# Patient Record
Sex: Female | Born: 1995 | Race: Black or African American | Hispanic: No | Marital: Single | Smoking: Former smoker
Health system: Southern US, Community
[De-identification: ages and names within clinical notes are randomized; demographics above are authoritative.]

## PROBLEM LIST (undated history)

## (undated) ENCOUNTER — Inpatient Hospital Stay (HOSPITAL_COMMUNITY): Payer: Self-pay

## (undated) DIAGNOSIS — O139 Gestational [pregnancy-induced] hypertension without significant proteinuria, unspecified trimester: Secondary | ICD-10-CM

## (undated) HISTORY — PX: CERVICAL CERCLAGE: SHX1329

---

## 2014-09-21 HISTORY — PX: DILATION AND CURETTAGE OF UTERUS: SHX78

## 2018-06-03 ENCOUNTER — Encounter (HOSPITAL_COMMUNITY): Payer: Self-pay

## 2018-06-03 ENCOUNTER — Emergency Department (HOSPITAL_COMMUNITY)
Admission: EM | Admit: 2018-06-03 | Discharge: 2018-06-03 | Discharge status: Against Medical Advice | Payer: 59 | Attending: Emergency Medicine | Admitting: Emergency Medicine

## 2018-06-03 ENCOUNTER — Other Ambulatory Visit: Payer: Self-pay

## 2018-06-03 DIAGNOSIS — O209 Hemorrhage in early pregnancy, unspecified: Secondary | ICD-10-CM | POA: Insufficient documentation

## 2018-06-03 DIAGNOSIS — Z3A00 Weeks of gestation of pregnancy not specified: Secondary | ICD-10-CM | POA: Diagnosis not present

## 2018-06-03 DIAGNOSIS — Z5321 Procedure and treatment not carried out due to patient leaving prior to being seen by health care provider: Secondary | ICD-10-CM | POA: Insufficient documentation

## 2018-06-03 DIAGNOSIS — R109 Unspecified abdominal pain: Secondary | ICD-10-CM | POA: Insufficient documentation

## 2018-06-03 DIAGNOSIS — O9989 Other specified diseases and conditions complicating pregnancy, childbirth and the puerperium: Secondary | ICD-10-CM | POA: Insufficient documentation

## 2018-06-03 LAB — COMPREHENSIVE METABOLIC PANEL
ALT: 7 U/L (ref 0–44)
AST: 14 U/L — ABNORMAL LOW (ref 15–41)
Albumin: 3.5 g/dL (ref 3.5–5.0)
Alkaline Phosphatase: 61 U/L (ref 38–126)
Anion gap: 10 (ref 5–15)
BUN: 9 mg/dL (ref 6–20)
CHLORIDE: 106 mmol/L (ref 98–111)
CO2: 25 mmol/L (ref 22–32)
CREATININE: 0.43 mg/dL — AB (ref 0.44–1.00)
Calcium: 9.3 mg/dL (ref 8.9–10.3)
GFR calc Af Amer: >60 mL/min (ref >60)
GFR calc non Af Amer: >60 mL/min (ref >60)
Glucose, Bld: 77 mg/dL (ref 70–99)
Potassium: 3.6 mmol/L (ref 3.5–5.1)
Sodium: 141 mmol/L (ref 135–145)
Total Bilirubin: 0.5 mg/dL (ref 0.3–1.2)
Total Protein: 7 g/dL (ref 6.5–8.1)

## 2018-06-03 LAB — CBC
HCT: 34 % — ABNORMAL LOW (ref 36.0–46.0)
Hemoglobin: 12 g/dL (ref 12.0–15.0)
MCH: 32 pg (ref 26.0–34.0)
MCHC: 35.3 g/dL (ref 30.0–36.0)
MCV: 90.7 fL (ref 78.0–100.0)
PLATELETS: 267 10*3/uL (ref 150–400)
RBC: 3.75 MIL/uL — ABNORMAL LOW (ref 3.87–5.11)
RDW: 12.7 % (ref 11.5–15.5)
WBC: 9.7 10*3/uL (ref 4.0–10.5)

## 2018-06-03 LAB — I-STAT BETA HCG BLOOD, ED (MC, WL, AP ONLY)

## 2018-06-03 LAB — LIPASE, BLOOD: LIPASE: 28 U/L (ref 11–51)

## 2018-06-03 NOTE — ED Notes (Signed)
Pt called for recheck v/s, no response from lobby 

## 2018-06-03 NOTE — ED Notes (Signed)
Pt asked to speak to staff re: wait times. Pt asked how low long before being seen and how many people are waiting prior to her arrival to be seen. Explained to pt unable to give specific wait times; pts will be seen according to acuity and room availability as soon as possible. Offered comfort measures; such as blanket. Vitals recently updated by EMT; per pt no immediate complaints/no visible signs of severe stress. RN advised. [employer]

## 2018-06-03 NOTE — ED Triage Notes (Addendum)
Pt reports that she is 5 months pregnant and experiencing a lot of cramping and pain since last night. Reports pinkish discharge. Denies any frank bleeding. Last check up was on 9/5 and everything was normal. Denies any N/V/D. Pt also reports an insect bite on her L lower leg. Redness noted around site. A&Ox4. Ambulatory. NAD.

## 2018-06-03 NOTE — ED Notes (Signed)
Pt called x3. No response 

## 2018-06-04 ENCOUNTER — Encounter (HOSPITAL_COMMUNITY): Payer: Self-pay | Admitting: Anesthesiology

## 2018-06-04 ENCOUNTER — Inpatient Hospital Stay (HOSPITAL_BASED_OUTPATIENT_CLINIC_OR_DEPARTMENT_OTHER): Payer: Medicaid - Out of State

## 2018-06-04 ENCOUNTER — Other Ambulatory Visit: Payer: Self-pay

## 2018-06-04 ENCOUNTER — Observation Stay (HOSPITAL_COMMUNITY)
Admission: EM | Admit: 2018-06-04 | Discharge: 2018-06-05 | Disposition: A | Discharge status: Home / Self Care | Payer: Medicaid - Out of State | Attending: Obstetrics and Gynecology | Admitting: Obstetrics and Gynecology

## 2018-06-04 ENCOUNTER — Encounter (HOSPITAL_COMMUNITY): Payer: Self-pay | Admitting: *Deleted

## 2018-06-04 ENCOUNTER — Telehealth: Payer: Self-pay | Admitting: Medical

## 2018-06-04 DIAGNOSIS — Z3A21 21 weeks gestation of pregnancy: Secondary | ICD-10-CM

## 2018-06-04 DIAGNOSIS — O9989 Other specified diseases and conditions complicating pregnancy, childbirth and the puerperium: Secondary | ICD-10-CM | POA: Diagnosis not present

## 2018-06-04 DIAGNOSIS — O26872 Cervical shortening, second trimester: Secondary | ICD-10-CM | POA: Diagnosis not present

## 2018-06-04 DIAGNOSIS — O209 Hemorrhage in early pregnancy, unspecified: Secondary | ICD-10-CM | POA: Insufficient documentation

## 2018-06-04 DIAGNOSIS — O09212 Supervision of pregnancy with history of pre-term labor, second trimester: Secondary | ICD-10-CM | POA: Diagnosis not present

## 2018-06-04 DIAGNOSIS — O09219 Supervision of pregnancy with history of pre-term labor, unspecified trimester: Secondary | ICD-10-CM

## 2018-06-04 DIAGNOSIS — L0291 Cutaneous abscess, unspecified: Secondary | ICD-10-CM

## 2018-06-04 DIAGNOSIS — O3432 Maternal care for cervical incompetence, second trimester: Secondary | ICD-10-CM

## 2018-06-04 DIAGNOSIS — R109 Unspecified abdominal pain: Secondary | ICD-10-CM | POA: Diagnosis not present

## 2018-06-04 DIAGNOSIS — L02416 Cutaneous abscess of left lower limb: Secondary | ICD-10-CM | POA: Insufficient documentation

## 2018-06-04 DIAGNOSIS — O4692 Antepartum hemorrhage, unspecified, second trimester: Secondary | ICD-10-CM | POA: Diagnosis not present

## 2018-06-04 DIAGNOSIS — Z3686 Encounter for antenatal screening for cervical length: Secondary | ICD-10-CM

## 2018-06-04 DIAGNOSIS — O99712 Diseases of the skin and subcutaneous tissue complicating pregnancy, second trimester: Secondary | ICD-10-CM | POA: Insufficient documentation

## 2018-06-04 DIAGNOSIS — Z3A22 22 weeks gestation of pregnancy: Secondary | ICD-10-CM | POA: Insufficient documentation

## 2018-06-04 DIAGNOSIS — O4690 Antepartum hemorrhage, unspecified, unspecified trimester: Secondary | ICD-10-CM

## 2018-06-04 DIAGNOSIS — R1030 Lower abdominal pain, unspecified: Secondary | ICD-10-CM

## 2018-06-04 HISTORY — DX: Gestational (pregnancy-induced) hypertension without significant proteinuria, unspecified trimester: O13.9

## 2018-06-04 LAB — SAMPLE TO BLOOD BANK

## 2018-06-04 LAB — TYPE AND SCREEN
ABO/RH(D): O POS
Antibody Screen: NEGATIVE

## 2018-06-04 LAB — CBC
HEMATOCRIT: 35.9 % — AB (ref 36.0–46.0)
HEMOGLOBIN: 12.2 g/dL (ref 12.0–15.0)
MCH: 31.4 pg (ref 26.0–34.0)
MCHC: 34 g/dL (ref 30.0–36.0)
MCV: 92.3 fL (ref 78.0–100.0)
Platelets: 247 10*3/uL (ref 150–400)
RBC: 3.89 MIL/uL (ref 3.87–5.11)
RDW: 12.6 % (ref 11.5–15.5)
WBC: 11.8 10*3/uL — AB (ref 4.0–10.5)

## 2018-06-04 LAB — URINALYSIS, ROUTINE W REFLEX MICROSCOPIC
Bilirubin Urine: NEGATIVE
Glucose, UA: NEGATIVE mg/dL
Hgb urine dipstick: NEGATIVE
KETONES UR: NEGATIVE mg/dL
Nitrite: NEGATIVE
PROTEIN: NEGATIVE mg/dL
Specific Gravity, Urine: 1.017 (ref 1.005–1.030)
pH: 8 (ref 5.0–8.0)

## 2018-06-04 LAB — COMPREHENSIVE METABOLIC PANEL
ALT: 8 U/L (ref 0–44)
AST: 14 U/L — AB (ref 15–41)
Albumin: 3.3 g/dL — ABNORMAL LOW (ref 3.5–5.0)
Alkaline Phosphatase: 63 U/L (ref 38–126)
Anion gap: 11 (ref 5–15)
BUN: <5 mg/dL — ABNORMAL LOW (ref 6–20)
CHLORIDE: 102 mmol/L (ref 98–111)
CO2: 22 mmol/L (ref 22–32)
Calcium: 9.1 mg/dL (ref 8.9–10.3)
Creatinine, Ser: 0.49 mg/dL (ref 0.44–1.00)
Glucose, Bld: 77 mg/dL (ref 70–99)
POTASSIUM: 3.3 mmol/L — AB (ref 3.5–5.1)
Sodium: 135 mmol/L (ref 135–145)
Total Bilirubin: 0.6 mg/dL (ref 0.3–1.2)
Total Protein: 6.4 g/dL — ABNORMAL LOW (ref 6.5–8.1)

## 2018-06-04 LAB — I-STAT BETA HCG BLOOD, ED (MC, WL, AP ONLY): I-stat hCG, quantitative: >2000 m[IU]/mL — ABNORMAL HIGH (ref <5)

## 2018-06-04 LAB — WET PREP, GENITAL
CLUE CELLS WET PREP: NONE SEEN
Sperm: NONE SEEN
Trich, Wet Prep: NONE SEEN
Yeast Wet Prep HPF POC: NONE SEEN

## 2018-06-04 LAB — ABO/RH: ABO/RH(D): O POS

## 2018-06-04 LAB — LIPASE, BLOOD: LIPASE: 23 U/L (ref 11–51)

## 2018-06-04 MED ORDER — LACTATED RINGERS IV SOLN: INTRAVENOUS | Status: DC

## 2018-06-04 MED ORDER — SODIUM CHLORIDE 0.9 % IV SOLN
INTRAVENOUS | Status: DC
  Administered 2018-06-04 – 2018-06-05 (×2): via INTRAVENOUS

## 2018-06-04 MED ORDER — AMOXICILLIN-POT CLAVULANATE 875-125 MG PO TABS: 1.0000 | ORAL_TABLET | Freq: Two times a day (BID) | ORAL | 0 refills | Status: DC

## 2018-06-04 MED ORDER — PRENATAL MULTIVITAMIN CH
1.0000 | ORAL_TABLET | Freq: Every day | ORAL | Status: DC
  Administered 2018-06-05: 1 via ORAL
  Filled 2018-06-04: qty 1

## 2018-06-04 MED ORDER — ACETAMINOPHEN 325 MG PO TABS: 650.0000 mg | ORAL_TABLET | ORAL | Status: DC | PRN

## 2018-06-04 MED ORDER — ZOLPIDEM TARTRATE 5 MG PO TABS: 5.0000 mg | ORAL_TABLET | Freq: Every evening | ORAL | Status: DC | PRN

## 2018-06-04 MED ORDER — LIDOCAINE HCL (PF) 1 % IJ SOLN
5.0000 mL | Freq: Once | INTRAMUSCULAR | Status: AC
  Administered 2018-06-04: 5 mL
  Filled 2018-06-04: qty 5

## 2018-06-04 MED ORDER — DOCUSATE SODIUM 100 MG PO CAPS
100.0000 mg | ORAL_CAPSULE | Freq: Every day | ORAL | Status: DC
  Administered 2018-06-05: 100 mg via ORAL
  Filled 2018-06-04: qty 1

## 2018-06-04 MED ORDER — CALCIUM CARBONATE ANTACID 500 MG PO CHEW: 2.0000 | CHEWABLE_TABLET | ORAL | Status: DC | PRN

## 2018-06-04 NOTE — MAU Note (Signed)
Pt. [facility].  Pt. Reports vag bleeding and dc since Thursday.  Pt. Denies LOF.  Pt. States she had some abd. Pain earlier today, but is not currently having any pain.

## 2018-06-04 NOTE — Progress Notes (Addendum)
Patient ID: Mary Allen, female   DOB: [birth year], 22 y.o.   MRN: [id number]  Gyn Attending: Phone conversation with Provider, describing vaginal bleeding and pressure symptoms in this 6468w4d G1P0 from [place], visiting area with complaints of pelvic pressure and vaginal bleeding small amount, with speculum exam reported as no current bleeding and an atypical appearing cervix , not unexpected for a pt with cerclage in place. [facility] for continuation of eval: plan is for pelvic U/S with cervical length measurement, Transvaginal eval to be included.  Tilda BurrowJohn V Ebonee Stober, MD [phone]

## 2018-06-04 NOTE — ED Notes (Signed)
[facility]

## 2018-06-04 NOTE — Telephone Encounter (Addendum)
Opened in error  Kathlene CoteWenzel, Memory Heinrichs N, PA-C 06/04/2018 5:52 PM

## 2018-06-04 NOTE — ED Provider Notes (Signed)
[facility] EMERGENCY [facility] Provider Note   CSN: [id number] Arrival date & time: 06/04/18  1003     History   Chief Complaint Chief Complaint  Patient presents with  . Abdominal Pain    HPI Mary Allen is a 22 y.o. female who is currently G2P1, [redacted] weeks pregnant requiring cervical cerclage due to early dilation last month who presents with complaints of abdominal discomfort intermittently x 2 days.  Patient states she is having abdominal cramping in the pelvic/lower abdomen that is coming and going, lasting for approximately 1 to 2 minutes, improved with certain positions.  She has not felt the baby move since yesterday afternoon. She is also noted some vaginal discharge that has blood in it. Had one episode of vomiting yesterday.  She states this feels similar to the cramping she was having prior to requiring the cerclage.  Her OB/GYN is located in [place] at [facility].  Her prior pregnancy was complicated with early cervical dilation resulting in preterm vaginal  birth at 27 weeks, otherwise uncomplicated.  Denies fever, chills, diarrhea, or blood in stool.   Patient also mentions LLE redness/pain/swelling x 3-4 days.   HPI  History reviewed. No pertinent past medical history.  There are no active problems to display for this patient.   History reviewed. No pertinent surgical history.   OB History    Gravida  1   Para      Term      Preterm      AB      Living        SAB      TAB      Ectopic      Multiple      Live Births               Home Medications    Prior to Admission medications   Not on File    Family History History reviewed. No pertinent family history.  Social History Social History   Tobacco Use  . Smoking status: Never Smoker  . Smokeless tobacco: Never Used  Substance Use Topics  . Alcohol use: Not on file  . Drug use: Not on file     Allergies   Patient has no known  allergies.   Review of Systems Review of Systems  Constitutional: Negative for chills and fever.  Gastrointestinal: Positive for abdominal pain and vomiting. Negative for blood in stool, constipation and diarrhea.  Genitourinary: Positive for vaginal bleeding and vaginal discharge.  Skin: Positive for wound.       Also mentions area of redness and swelling to the left lower leg which has been here for the past 3 to 4 days.  Painful.  All other systems reviewed and are negative.    Physical Exam Updated Vital Signs BP 119/72 (BP Location: Right Arm)   Pulse 92   Temp 98.5 F (36.9 C) (Oral)   Resp 20   SpO2 100%   Physical Exam  Constitutional: She appears well-developed and well-nourished.  Non-toxic appearance. No distress.  HENT:  Head: Normocephalic and atraumatic.  Eyes: Conjunctivae are normal. Right eye exhibits no discharge. Left eye exhibits no discharge.  Neck: Neck supple.  Cardiovascular: Normal rate and regular rhythm.  Pulmonary/Chest: Effort normal and breath sounds normal. No respiratory distress. She has no wheezes. She has no rhonchi. She has no rales.  Respiration even and unlabored  Abdominal: There is no tenderness.  Gravid appearing abdomen. Fundus just above  the umbilicus.   Genitourinary: Pelvic exam was performed with patient supine. There is no tenderness on the right labia. There is no tenderness on the left labia. No bleeding in the vagina. Vaginal discharge (white thick) found.  Genitourinary Comments: Cervical os is abnormal appearing, possibly open. Sutures in place consistent with hx of cervical cerclage.  RN Albin Felling present as chaperone.   Neurological: She is alert.  Clear speech. Sensation grossly intact to bilateral lower extremities. 5/5 symmetric strength with plantar/dorsiflexion.   Skin: Skin is warm and dry. No rash noted.  LLE: There is an approximately 7 cm diameter area of erythema to the lateral lower leg with palpable induration.   This area is warm to touch.  Tender to palpation.  Centrally there is an area of about 2 cm with palpable fluctuance.  Erythema does not have a streaking appearance.   Psychiatric: She has a normal mood and affect. Her behavior is normal.  Nursing note and vitals reviewed.      ED Treatments / Results  Labs (all labs ordered are listed, but only abnormal results are displayed) Labs Reviewed  WET PREP, GENITAL - Abnormal; Notable for the following components:      Result Value   WBC, Wet Prep HPF POC MANY (*)    All other components within normal limits  COMPREHENSIVE METABOLIC PANEL - Abnormal; Notable for the following components:   Potassium 3.3 (*)    BUN <5 (*)    Total Protein 6.4 (*)    Albumin 3.3 (*)    AST 14 (*)    All other components within normal limits  CBC - Abnormal; Notable for the following components:   WBC 11.8 (*)    HCT 35.9 (*)    All other components within normal limits  URINALYSIS, ROUTINE W REFLEX MICROSCOPIC - Abnormal; Notable for the following components:   APPearance CLOUDY (*)    Leukocytes, UA MODERATE (*)    Bacteria, UA RARE (*)    All other components within normal limits  I-STAT BETA HCG BLOOD, ED ([facility], [facility], [facility] ONLY) - Abnormal; Notable for the following components:   I-stat hCG, quantitative >2,000.0 (*)    All other components within normal limits  LIPASE, BLOOD  SAMPLE TO BLOOD BANK  ABO/RH  GC/CHLAMYDIA PROBE AMP ([facility]) NOT AT [facility]    EKG None  Radiology No results found.  Procedures  EMERGENCY DEPARTMENT [place] PREGNANCY "Study: Limited Ultrasound of the Pelvis for Pregnancy"  INDICATIONS:Pregnancy(required) and Abdominal or pelvic pain Multiple views of the uterus and pelvic cavity were obtained in real-time with a multi-frequency probe.  APPROACH:Transabdominal  PERFORMED BY: Supervising physician Dr. Rush Landmark IMAGES ARCHIVED?: Yes FETAL HEART RATE: 150   EMERGENCY DEPARTMENT US SOFT TISSUE  INTERPRETATION "Study: Limited Soft Tissue Ultrasound"  INDICATIONS: Pain and Soft tissue infection Multiple views of the body part were obtained in real-time with a multi-frequency linear probe  PERFORMED BY: Myself IMAGES ARCHIVED?: No SIDE:Left and Right  BODY PART:Lower extremity INTERPRETATION:  Abcess present  .Marland KitchenIncision and Drainage Date/Time: 06/04/2018 1:28 PM Performed by: Cherly Anderson, PA-C Authorized by: Cherly Anderson, PA-C   Consent:    Consent obtained:  Verbal   Consent given by:  Patient   Risks discussed:  Bleeding, damage to other organs, infection, incomplete drainage and pain   Alternatives discussed:  No treatment and alternative treatment Location:    Type:  Abscess   Size:  2   Location:  Lower extremity  Lower extremity location:  Leg   Leg location:  L lower leg Pre-procedure details:    Skin preparation:  Betadine Anesthesia (see MAR for exact dosages):    Anesthesia method:  Local infiltration   Local anesthetic:  Lidocaine 1% w/o epi Procedure type:    Complexity:  Simple Procedure details:    Incision types:  Stab incision   Scalpel blade:  11   Wound management:  Probed and deloculated and irrigated with saline   Drainage:  Bloody and purulent   Drainage amount:  Moderate   Wound treatment:  Wound left open   Packing materials:  None Post-procedure details:    Patient tolerance of procedure:  Tolerated well, no immediate complications   (including critical care time)  Medications Ordered in ED Medications - No data to display   Initial Impression / Assessment and Plan / ED Course  I have reviewed the triage vital signs and the nursing notes.  Pertinent labs & imaging results that were available during my care of the patient were reviewed by me and considered in my medical decision making (see chart for details).   Patient is [redacted] weeks pregnant, pregnancy complicated by cerclage placement 1 month prior who  presents with intermittent abdominal pain which she states feel like contraction with associated with the vaginal discharge as well as not feeling the baby move.  Abdomen nontender.  Bedside ultrasound performed with supervising physician Dr. Rush Landmark reveals that the baby is moving with a heart rate of 140s to 150s.  Plan to consult OB/GYN.   11:50: CONSULT: Discussed case with obstetrician Dr. Macon Large- recommends pelvic exam to assess if cervical os is open or closed, requesting GC/chlamydia and wet prep cultures be performed. Will need ultrasound to determine internal cervical length- possibly to be performed here or with transfer to womens.   Pelvic exam performed, cervix is abnormal appearing, difficult assessment, will re-discuss with obgyn.   12:58: CONSULT: Discussed with obstetrician Dr. Emelda Fear- in agreement with transfer to MAU for further evaluation.   In regards to patient's mentioned RLE pain/swelling/redness- bedside US confirms abscess with surrounding cellulitis, beside I&D performed per procedure note above, tolerated well. Patient has hx of similar that has required abx. Discussed with pharmacy- recommend Augmentin 875 q12h for 5 days. Paper prescription provided. Discussed importance of warm compresses. Wound recheck in 2 days. I discussed results, treatment plan, need for follow-up, and return precautions with the patient in regards to her abscess/cellulitis. Defer further ob recommendations to obstetrician upon his/her assessment.   Findings and plan of care discussed with supervising physician Dr. Rush Landmark who personally evaluated and examined this patient and is in agreement with plan.    Final Clinical Impressions(s) / ED Diagnoses   Final diagnoses:  [redacted] weeks gestation of pregnancy  Lower abdominal pain  Abscess    ED Discharge Orders    None       Cherly Anderson, PA-C 06/04/18 1509    Tegeler, Canary Brim, MD 06/04/18 [phone]

## 2018-06-04 NOTE — H&P (Signed)
Mary Allen is a 22 y.o. female admitted for overnight observation due to short cervix and an episode of vaginal bleeding noted early on 06/04/2018 upon returning to bed from the bathroom, without recurrence.  She was seen at [facility] and transferred to [facility] hospital after presenting for the bleeding.  She is new to [facility] having had prenatal care at [facility] that was notable for placement of a Mersilene cerclage August 15 due to "short cervix" at [facility].  She moved to [facility] on August 6.  She has been receiving 17 P self-administered injections weekly with last injection September 12.  She has had no recent intercourse.  She denies rupture membranes or abdominal cramping.  She did have some questionable abdominal tightening upon initial arrival to the hospital but none now.  External toco monitoring while in the MAU she has one single contraction in the first hour monitoring, which the patient did not feel Ultrasound has been performed which shows cervical funneling, with less than 1 cm of uninvolved cervix.  The spleen cerclage is palpably in place.  Speculum exam reveals normal vaginal discharge, and a normal relatively normal-appearing cervix with the Mersilene not at 2:00 the cerclage palpable all the way around the cervix at 8 or 9:00 and the external loss is closed to palpation. Ultrasound is significant with and that it shows the funneling down to less than 1 cm of length and there is some debris in the cervical funneling. The cervix is nontender to palpation, the uterus is nontender as well White count is within normal limits for pregnancy OB History    Gravida  2   Para  1   Term      Preterm  1   AB      Living  1     SAB      TAB      Ectopic      Multiple      Live Births  1          Past Medical History:  Diagnosis Date  . Pregnancy induced hypertension    Past Surgical History:  Procedure Laterality Date  .  DILATION AND CURETTAGE OF UTERUS  2016  . NO PAST SURGERIES    Mersilene cerclage August 15 [facility] Family History: family history is not on file. Social History:  reports that she has never smoked. She has never used smokeless tobacco. She reports that she drank alcohol. She reports that she does not use drugs.  Prenatal records will be requested   Maternal Diabetes:  Genetic Screening:  Maternal Ultrasounds/Referrals:  Fetal Ultrasounds or other Referrals:   Maternal Substance Abuse:   Significant Maternal Medications:   Significant Maternal Lab Results:   Other Comments:    ROS see HPI no vaginal fluid, no bleeding this pregnancy prior to last night History   Blood pressure (!) 100/54, pulse 89, temperature 98.2 F (36.8 C), resp. rate 18, weight 59.9 kg, SpO2 100 %. Maternal Exam:  Uterine Assessment: Contraction frequency is rare.   Abdomen: Patient reports no abdominal tenderness. Fundal height is To umbilicus.    Introitus: Normal vulva. Normal vagina.  Ferning test: not done.  Nitrazine test: not done.  Pelvis: adequate for delivery.   Cervix: Cervix evaluated by sterile speculum exam and digital exam.     Physical Exam  Constitutional: She is oriented to person, place, and time. She appears well-developed and well-nourished. No distress.  HENT:  Head: Normocephalic.  Eyes: Pupils are equal, round, and reactive to light.  Neck: Normal range of motion.  Cardiovascular: Normal rate.  Respiratory: Effort normal.  GI: Soft.  Gravid nontender uterus  Genitourinary: Vagina normal.  Genitourinary Comments: Cervix appears of normal length with the cerclage not at 2:00 with characteristic tightening of the outer shape of the cervix which protrudes well into the vagina there is at least 3 cm cervix protruding into the vagina.  External loss is closed to digital exam.  There is no suspicion of membrane rupture  Musculoskeletal: Normal range of motion.  Neurological:  She is alert and oriented to person, place, and time.  Skin: Skin is warm and dry.  Psychiatric: She has a normal mood and affect. Her behavior is normal. Judgment and thought content normal.    CBC    Component Value Date/Time   WBC 11.8 (H) 06/04/2018 1043   RBC 3.89 06/04/2018 1043   HGB 12.2 06/04/2018 1043   HCT 35.9 (L) 06/04/2018 1043   PLT 247 06/04/2018 1043   MCV 92.3 06/04/2018 1043   MCH 31.4 06/04/2018 1043   MCHC 34.0 06/04/2018 1043   RDW 12.6 06/04/2018 1043    Prenatal labs: ABO, Rh: --/--/O POS Performed at [facility], [address] [place], [place] [id number]  (09/14 1541) Antibody:   Rubella:   RPR:    HBsAg:    HIV:    GBS:  Ordered  Assessment/Plan:  Second trimester single episode of vaginal bleeding Short cervix with funneling, Mersilene cerclage in place Plan: Overnight observation repeat exam in a.m.  External toco overnight repeat CBC in a.m. Patient to continue 17 P as self-administered next shot due 06/09/2018   Tilda BurrowJohn V Lovey Crupi 06/04/2018, 6:58 PM

## 2018-06-04 NOTE — ED Notes (Signed)
[facility] ([facility]) called @ 1322-per Dr. Tegeler-called By Marylene LandAngela

## 2018-06-04 NOTE — ED Notes (Signed)
Mary Allen, [facility] performed doppler, 156 FHR

## 2018-06-04 NOTE — ED Notes (Signed)
Pt still denies FM.

## 2018-06-04 NOTE — ED Triage Notes (Addendum)
Pt in c/o lower abdominal pain and cramping that started two days ago, pt is [redacted] weeks pregnant, [facility] but [facility] due to wait, reports redness in her vaginal discharge but denies consistent bleeding, G2P1, pain is intermittent, reports she has not felt baby move since last night, pt reports high risk pregnancy with cervical cerclage performed last month due to early cervical dilation

## 2018-06-04 NOTE — ED Notes (Signed)
Report given to [facility] 

## 2018-06-05 DIAGNOSIS — Z3A22 22 weeks gestation of pregnancy: Secondary | ICD-10-CM | POA: Diagnosis not present

## 2018-06-05 DIAGNOSIS — O26872 Cervical shortening, second trimester: Secondary | ICD-10-CM

## 2018-06-05 LAB — CBC
HCT: 30.5 % — ABNORMAL LOW (ref 36.0–46.0)
HEMOGLOBIN: 10.8 g/dL — AB (ref 12.0–15.0)
MCH: 32.2 pg (ref 26.0–34.0)
MCHC: 35.4 g/dL (ref 30.0–36.0)
MCV: 91 fL (ref 78.0–100.0)
Platelets: 213 10*3/uL (ref 150–400)
RBC: 3.35 MIL/uL — AB (ref 3.87–5.11)
RDW: 12.9 % (ref 11.5–15.5)
WBC: 8.6 10*3/uL (ref 4.0–10.5)

## 2018-06-05 NOTE — Progress Notes (Signed)
D/c instructions discussed & given; to include when to call the doctor, pelvic rest, & limited activity.  Pt stable for d/c home with sig other.  Pt w/c out by NT.

## 2018-06-05 NOTE — Discharge Summary (Signed)
Physician Discharge Summary  Patient ID: Mary Allen MRN: [id number] DOB/AGE: [birth year] 24 y.o.  Admit date: 06/04/2018 Discharge date: 06/05/2018  Admission Diagnoses: light vaginal bleeding at 558w5d                                         cervical insufficiency                                          S/p Merselene cerclage         Discharge Diagnoses:  Same, bleeding resolved. Active Problems:   Short cervix during pregnancy in second trimester s/p cerclage  Discharged Condition: good  Hospital Course: Expand All Collapse All    Show:Clear all  Mary Allen is a 22 y.o. female admitted for overnight observation due to short cervix and an episode of vaginal bleeding noted early on 06/04/2018 upon returning to bed from the bathroom, without recurrence.  [facility] and [facility] after presenting for the bleeding.  [place] [facility] [place] that was notable for placement of a Mersilene cerclage August 15 due to "short cervix" at [facility].  [place] [facility] [place] [facility] on August [facility].  She has been receiving 17 P self-administered injections weekly with last injection September 12.  She has had no recent intercourse.  She denies rupture membranes or abdominal cramping.  She did have some questionable abdominal tightening upon initial arrival to the hospital but none now.  External toco monitoring while in the MAU she has one single contraction in the first hour monitoring, which the patient did not feel Ultrasound has been performed which shows cervical funneling, with less than 1 cm of uninvolved cervix.  The spleen cerclage is palpably in place.  Speculum exam reveals normal vaginal discharge, and a normal relatively normal-appearing cervix with the Mersilene not at 2:00 the cerclage palpable all the way around the cervix at 8 or 9:00 and the external loss is closed to  palpation. Ultrasound is significant with and that it shows the funneling down to less than 1 cm of length and there is some debris in the cervical funneling. The cervix is nontender to palpation, the uterus is nontender as well White count is within normal limits for pregnancy     Consults: None  Significant Diagnostic Studies: labs:  CBC    Component Value Date/Time   WBC 8.6 06/05/2018 0603   RBC 3.35 (L) 06/05/2018 0603   HGB 10.8 (L) 06/05/2018 0603   HCT 30.5 (L) 06/05/2018 0603   PLT 213 06/05/2018 0603   MCV 91.0 06/05/2018 0603   MCH 32.2 06/05/2018 0603   MCHC 35.4 06/05/2018 0603   RDW 12.9 06/05/2018 0603     Treatments: IV hydration and overnight observation, no contractions or further bleeding  Discharge Exam: Blood pressure 114/61, pulse 91, temperature 98.7 F (37.1 C), temperature source Oral, resp. rate 17, weight 59.9 kg, SpO2 99 %. General appearance: alert, cooperative and no distress Head: Normocephalic, without obvious abnormality, atraumatic GI: soft, non-tender; bowel sounds normal; no masses,  no organomegaly Extremities: extremities normal, atraumatic, no cyanosis or edema and Homans sign is negative, no sign of DVT no further bleeding, no contractions.  Disposition: Discharge disposition:  01-Home or Self Care       Discharge Instructions    Call MD for:  severe uncontrolled pain   Complete by:  As directed    Diet - low sodium heart healthy   Complete by:  As directed    Discharge instructions   Complete by:  As directed    Followup will be with the high risk clinic at [facility] , in 2 wks. [phone]   Increase activity slowly   Complete by:  As directed      Allergies as of 06/05/2018   No Known Allergies     Medication List    TAKE these medications   amoxicillin-clavulanate 875-125 MG tablet Commonly known as:  AUGMENTIN Take 1 tablet by mouth every 12 (twelve) hours.   hydroxyprogesterone caproate 250 mg/mL Oil  injection Commonly known as:  MAKENA Inject 250 mg into the muscle once.   prenatal multivitamin Tabs tablet Take 1 tablet by mouth daily at 12 noon.      Follow-up Information    [facility] Follow up in 2 week(s).   Specialty:  Obstetrics and Gynecology Contact information: [address] [place] [address] [phone]          Signed: Tilda Burrow 06/05/2018, 9:49 AM

## 2018-06-05 NOTE — Discharge Instructions (Signed)
Cervical Insufficiency °Cervical insufficiency is when the cervix is weak and starts to open (dilate) and thin (efface) before the pregnancy is at term and before labor starts. This is also called incompetent cervix. It can happen during the second or third trimester when the fetus starts putting pressure on the cervix. Treatment may reduce the risk of problems for you and your baby. Cervical insufficiency can lead to: °· Loss of the baby (miscarriage). °· Breaking of the sac that holds the baby (amniotic sac). This is also called preterm premature rupture of the membranes,PPROM. °· The baby being born early (preterm birth). ° °What are the causes? °The cause of this condition is not well known. However, it may be caused by abnormalities in the cervix and other factors such as inflammation or infection. °What increases the risk? °This condition is more likely to develop if: °· You have a shorter cervix than normal. °· Your cervix was damaged or injured during a past pregnancy or surgery. °· You were born with a cervical defect. °· You have had a procedure done on the cervix, such as cervical biopsy. °· You have a history of: °? Cervical insufficiency. °? PPROM. °· You have ended several pregnancies through abortion. °· You were exposed to the drug diethylstilbestrol (DES). ° °What are the signs or symptoms? °Symptoms of this condition can vary. Sometimes there no symptoms for this condition, and at other times there are mild symptoms that start between weeks 14 and 20 of pregnancy. The symptoms may last several days or weeks. These symptoms include: °· Light spotting or bleeding from the vagina. °· Pelvic pressure. °· A change in vaginal discharge, such as changes from clear, white, or light yellow to pink or tan. °· Back pain. °· Abdominal pain or cramping. ° °How is this diagnosed? °This condition may be diagnosed based on: °· Your symptoms. °· Your medical history, including: °? Any problems during past  pregnancies, such as miscarriages. °? Any procedures performed on your cervix. °? Any history of cervical insufficiency. ° °During the second trimester, cervical insufficiency may be diagnosed based on: °· An ultrasound done with a probe inserted into your vagina (transvaginal ultrasound). °· A pelvic exam. °· Tests of fluid in the amniotic sac. This is done to rule out infection. ° °How is this treated? °This condition may be managed by: °· Limiting physical activity. °· Limiting activity at home or in the hospital. °· Pelvic rest. This means that there should be no sexual intercourse or placing anything in the vagina. °· A procedure to sew the cervix closed and prevent it from opening too early (cerclage). The stitches (sutures) are removed between weeks 36 and 38 to avoid problems during labor. Cerclage may be recommended if: °? You have a history of miscarriages or preterm births without a known cause. °? You have a short cervix. A short cervix is identified by ultrasound. °? Your cervix has dilated before 24 weeks of pregnancy. ° °Follow these instructions at home: °· Get plenty of rest and lessen activity as told by your health care provider. Ask your health care provider what activities are safe for you. °· If pelvic rest was recommended, you shouldnot have sex, use tampons, douche, or place anything inside your vagina until your health care provider says that this is okay. °· Take over-the-counter and prescription medicines only as told by your health care provider. °· Keep all follow-up visits and prenatal visits as told by your health care provider. This is important. °  Get help right away if:  You have vaginal bleeding, even if it is a small amount or even if it is painless.  You have pain in your abdomen or your lower back.  You have a feeling of increased pressure in your pelvis.  You have vaginal discharge that changes from clear, white, or light yellow to pink or tan.  You have a  fever.  You have severe nausea or vomiting. Summary  Cervical insufficiency is when the cervix is weak and starts to dilate and efface before the pregnancy is at term and before labor starts.  Symptoms of this condition can vary from no symptoms to mild symptoms that start between weeks 14 and 20 of pregnancy. The symptoms may last several days or weeks.  This condition may be managed by limiting physical activity, having pelvic rest, or having cervical cerclage.  If pelvic rest was recommended, you should not have sex, use tampons, use a douche, or place anything inside your vagina until your health care provider says that this is okay. This information is not intended to replace advice given to you by your health care provider. Make sure you discuss any questions you have with your health care provider. Document Released: 09/07/2005 Document Revised: 09/10/2016 Document Reviewed: 09/10/2016 Elsevier Interactive Patient Education  2017 Else[employer] Inc.  Cervical Cerclage, Care After This sheet gives you information about how to care for yourself after your procedure. Your health care provider may also give you more specific instructions. If you have problems or questions, contact your health care provider. What can I expect after the procedure? After your procedure, it is common to have:  Cramping in your abdomen.  Mucus discharge for several days.  Painful urination (dysuria).  Small drops of blood coming from your vagina (spotting).  Follow these instructions at home:  Follow instructions from your health care provider about bed rest, if this applies. You may need to be on bed rest for up to 3 days.  Take over-the-counter and prescription medicines only as told by your health care provider.  Do not drive or use heavy machinery while taking prescription pain medicine.  Keep track of your vaginal discharge and watch for any changes. If you notice changes, tell your health care  provider.  Avoid physical activities and exercise until your health care provider approves. Ask your health care provider what activities are safe for you.  Until your health care provider approves: ? Do not douche. ? Do not have sexual intercourse.  Keep all pre-birth (prenatal) visits and all follow-up visits as told by your health care provider. This is important. You will probably have weekly visits to have your cervix checked, and you may need an ultrasound. Contact a health care provider if:  You have abnormal or bad-smelling vaginal discharge, such as clots.  You develop a rash on your skin. This may look like redness and swelling.  You become light-headed or feel like you are going to faint.  You have abdominal pain that does not get better with medicine.  You have persistent nausea or vomiting. Get help right away if:  You have vaginal bleeding that is heavier or more frequent than spotting.  You are leaking fluid or have a gush of fluid from your vagina (your water breaks).  You have a fever or chills.  You faint.  You have uterine contractions. These may feel like: ? A back ache. ? Lower abdominal pain. ? Mild cramps, similar to menstrual cramps. ? Tightening  or pressure in your abdomen.  You think that your baby is not moving as much as usual, or you cannot feel your baby move.  You have chest pain.  You have shortness of breath. This information is not intended to replace advice given to you by your health care provider. Make sure you discuss any questions you have with your health care provider. Document Released: 06/28/2013 Document Revised: 05/06/2016 Document Reviewed: 04/10/2016 Elsevier Interactive Patient Education  [employer].

## 2018-06-05 NOTE — Progress Notes (Signed)
Patient ID: Mary Allen, female   DOB: 1996-01-01, 22 y.o.   MRN: [id number] FACULTY PRACTICE ANTEPARTUM(COMPREHENSIVE) NOTE  Mary Allen is a 22 y.o. G2P0101 at 2353w5d  who is admitted for lite vaginal bleeding s/p Cerclage.   Fetal presentation is cephalic. Length of Stay:  1  Days  Subjective: Pt slept thru night ,no contractions Patient reports the fetal movement as active. Patient reports uterine contraction  activity as none. Patient reports  vaginal bleeding as none. Patient describes fluid per vagina as None.  Vitals:  Blood pressure (!) 111/50, pulse 79, temperature 98.1 F (36.7 C), temperature source Oral, resp. rate 17, weight 59.9 kg, SpO2 100 %. Physical Examination:  General appearance - alert, well appearing, and in no distress and oriented to person, place, and time Heart - normal rate and regular rhythm Abdomen - soft, nontender, nondistended Fundal Height:  size equals dates Cervical Exam: Not evaluated. And  Extremities: extremities normal, atraumatic, no cyanosis or edema and Homans sign is negative, no sign of DVT with DTRs 2+ bilaterally Membranes:intact  Fetal Monitoring:  Baseline: 145 bpm, Variability: Fair (1-6 bpm), Accelerations: Non-reactive but appropriate for gestational age, Decelerations: Absent and pt comfortable, uterus nontender  Labs:  Results for orders placed or performed during the hospital encounter of 06/04/18 (from the past 24 hour(s))  Lipase, blood   Collection Time: 06/04/18 10:43 AM  Result Value Ref Range   Lipase 23 11 - 51 U/L  Comprehensive metabolic panel   Collection Time: 06/04/18 10:43 AM  Result Value Ref Range   Sodium 135 135 - 145 mmol/L   Potassium 3.3 (L) 3.5 - 5.1 mmol/L   Chloride 102 98 - 111 mmol/L   CO2 22 22 - 32 mmol/L   Glucose, Bld 77 70 - 99 mg/dL   BUN <5 (L) 6 - 20 mg/dL   Creatinine, Ser 4.090.49 0.44 - 1.00 mg/dL   Calcium 9.1 8.9 - 81.110.3 mg/dL   Total Protein 6.4 (L) 6.5 - 8.1 g/dL   Albumin 3.3 (L) 3.5 - 5.0 g/dL   AST 14 (L) 15 - 41 U/L   ALT 8 0 - 44 U/L   Alkaline Phosphatase 63 38 - 126 U/L   Total Bilirubin 0.6 0.3 - 1.2 mg/dL   GFR calc non Af Amer >60 >60 mL/min   GFR calc Af Amer >60 >60 mL/min   Anion gap 11 5 - 15  CBC   Collection Time: 06/04/18 10:43 AM  Result Value Ref Range   WBC 11.8 (H) 4.0 - 10.5 K/uL   RBC 3.89 3.87 - 5.11 MIL/uL   Hemoglobin 12.2 12.0 - 15.0 g/dL   HCT 91.435.9 (L) 78.236.0 - 95.646.0 %   MCV 92.3 78.0 - 100.0 fL   MCH 31.4 26.0 - 34.0 pg   MCHC 34.0 30.0 - 36.0 g/dL   RDW 21.312.6 08.611.5 - 57.815.5 %   Platelets 247 150 - 400 K/uL  Sample to Blood Bank   Collection Time: 06/04/18 10:46 AM  Result Value Ref Range   Blood Bank Specimen SAMPLE AVAILABLE FOR TESTING    Sample Expiration      06/05/2018 Performed at [facility], [address] [place], [place] [address]   I-Stat beta hCG blood, ED   Collection Time: 06/04/18 10:57 AM  Result Value Ref Range   I-stat hCG, quantitative >2,000.0 (H) <5 mIU/mL   Comment 3          Urinalysis, Routine w reflex microscopic  Collection Time: 06/04/18 12:30 PM  Result Value Ref Range   Color, Urine YELLOW YELLOW   APPearance CLOUDY (A) CLEAR   Specific Gravity, Urine 1.017 1.005 - 1.030   pH 8.0 5.0 - 8.0   Glucose, UA NEGATIVE NEGATIVE mg/dL   Hgb urine dipstick NEGATIVE NEGATIVE   Bilirubin Urine NEGATIVE NEGATIVE   Ketones, ur NEGATIVE NEGATIVE mg/dL   Protein, ur NEGATIVE NEGATIVE mg/dL   Nitrite NEGATIVE NEGATIVE   Leukocytes, UA MODERATE (A) NEGATIVE   Bacteria, UA RARE (A) NONE SEEN   Squamous Epithelial / LPF 11-20 0 - 5   Mucus PRESENT    Amorphous Crystal PRESENT   Wet prep, genital   Collection Time: 06/04/18 12:43 PM  Result Value Ref Range   Yeast Wet Prep HPF POC NONE SEEN NONE SEEN   Trich, Wet Prep NONE SEEN NONE SEEN   Clue Cells Wet Prep HPF POC NONE SEEN NONE SEEN   WBC, Wet Prep HPF POC MANY (A) NONE SEEN   Sperm NONE SEEN   ABO/Rh   Collection Time:  06/04/18  3:41 PM  Result Value Ref Range   ABO/RH(D)      O POS Performed at [facility], [address], [place], [place] [address]   Type and screen [facility]   Collection Time: 06/04/18  7:07 PM  Result Value Ref Range   ABO/RH(D) O POS    Antibody Screen NEG    Sample Expiration      06/07/2018 Performed at [facility], [address], [place], [place] [address]   CBC   Collection Time: 06/05/18  6:03 AM  Result Value Ref Range   WBC 8.6 4.0 - 10.5 K/uL   RBC 3.35 (L) 3.87 - 5.11 MIL/uL   Hemoglobin 10.8 (L) 12.0 - 15.0 g/dL   HCT 09.8 (L) 11.9 - 14.7 %   MCV 91.0 78.0 - 100.0 fL   MCH 32.2 26.0 - 34.0 pg   MCHC 35.4 30.0 - 36.0 g/dL   RDW 82.9 56.2 - 13.0 %   Platelets 213 150 - 400 K/uL    Imaging Studies:        Medications:  Scheduled . docusate sodium  100 mg Oral Daily  . prenatal multivitamin  1 tablet Oral Q1200   I have reviewed the patient's current medications.  ASSESSMENT: Patient Active Problem List   Diagnosis Date Noted  . Short cervix during pregnancy in second trimester 06/04/2018    PLAN: Case reviewed with MFM Joy, plan is for d/c today, f/u 1wk for betamethasone, Note sent to [facility] to help with followup appt.  Tilda Burrow 06/05/2018,6:58 AM

## 2018-06-05 NOTE — Progress Notes (Signed)
Left leg dressing redressed with gauze and hypafix.

## 2018-06-06 LAB — GC/CHLAMYDIA PROBE AMP (~~LOC~~) NOT AT ARMC
CHLAMYDIA, DNA PROBE: NEGATIVE
Neisseria Gonorrhea: NEGATIVE

## 2018-06-07 LAB — CULTURE, BETA STREP (GROUP B ONLY)

## 2018-06-09 ENCOUNTER — Ambulatory Visit: Payer: 59 | Admitting: Clinical

## 2018-06-09 ENCOUNTER — Ambulatory Visit (INDEPENDENT_AMBULATORY_CARE_PROVIDER_SITE_OTHER): Payer: 59 | Admitting: Student

## 2018-06-09 ENCOUNTER — Encounter: Payer: Self-pay | Admitting: Student

## 2018-06-09 VITALS — BP 114/56 | HR 81 | Ht 59.0 in | Wt 143.0 lb

## 2018-06-09 DIAGNOSIS — O139 Gestational [pregnancy-induced] hypertension without significant proteinuria, unspecified trimester: Secondary | ICD-10-CM

## 2018-06-09 DIAGNOSIS — O132 Gestational [pregnancy-induced] hypertension without significant proteinuria, second trimester: Secondary | ICD-10-CM

## 2018-06-09 DIAGNOSIS — O09899 Supervision of other high risk pregnancies, unspecified trimester: Secondary | ICD-10-CM | POA: Insufficient documentation

## 2018-06-09 DIAGNOSIS — O0990 Supervision of high risk pregnancy, unspecified, unspecified trimester: Secondary | ICD-10-CM

## 2018-06-09 DIAGNOSIS — O26872 Cervical shortening, second trimester: Secondary | ICD-10-CM

## 2018-06-09 DIAGNOSIS — Z8759 Personal history of other complications of pregnancy, childbirth and the puerperium: Secondary | ICD-10-CM

## 2018-06-09 DIAGNOSIS — Z8751 Personal history of pre-term labor: Secondary | ICD-10-CM

## 2018-06-09 DIAGNOSIS — O0992 Supervision of high risk pregnancy, unspecified, second trimester: Secondary | ICD-10-CM

## 2018-06-09 DIAGNOSIS — O09212 Supervision of pregnancy with history of pre-term labor, second trimester: Secondary | ICD-10-CM

## 2018-06-09 MED ORDER — ASPIRIN 81 MG PO CHEW: 81.0000 mg | CHEWABLE_TABLET | Freq: Every day | ORAL | 1 refills | Status: DC

## 2018-06-09 NOTE — Progress Notes (Addendum)
  Subjective:    Mary Allen is being seen today for her first obstetrical visit.  This is not a planned pregnancy. She is at 623w2d gestation. Her obstetrical history is significant for high blood pressure (which may have contributed to her PT delivery) and pre-term delivery at 27 weeks. Marland Kitchen. She was not induced due to high blood pressure.  Relationship with FOB: significant other, living together. Patient does not intend to breast feed. Pregnancy history fully reviewed.  Patient says that she developed high blood pressure with her 1st child. She was not put on any medicines. This was in [place].  [place] then moved to [place], [place], and [place] care in [place]. She delivered at 27 weeks in [place], after she started dilating on her own. She started doing Makena at 18 weeks for this pregnancy; her last injection was September 12. She has three injections left.  Patient reports no complaints.  Patient had cerclage placed in [place] in mid-August, and [facility] bleeding episode on [facility]-14.   Review of Systems:   Review of Systems  Constitutional: Negative.   HENT: Negative.   Respiratory: Negative.   Cardiovascular: Negative.   Gastrointestinal: Negative.   Genitourinary: Negative.     Objective:     BP (!) 114/56   Pulse 81   Ht 4\' 11"  (1.499 m)   Wt 143 lb (64.9 kg)   BMI 28.88 kg/m  Physical Exam  Constitutional: She appears well-developed.  HENT:  Head: Normocephalic.  Neck: Normal range of motion.  GI: Soft.  Genitourinary: Vagina normal.  Musculoskeletal: Normal range of motion.  Neurological: She is alert.  Skin: Skin is warm.    Exam    Assessment:    Pregnancy: [id number] Patient Active Problem List   Diagnosis Date Noted  . Supervision of high risk pregnancy, antepartum 06/09/2018  . Short cervix during pregnancy in second trimester 06/04/2018       Plan:     Initial labs drawn. Prenatal vitamins. Problem list reviewed and  updated. AFP3 discussed: she did quad screen; it was negative.  Role of ultrasound in pregnancy discussed; fetal survey: ordered follow-up for anatomy scan.  Amniocentesis discussed: not indicated.  Follow up in 3 weeks with [facility]. Due to patient's possible history of GHTN and PTB, I do not want patient to wait 4 weeks to be seen by a provider.   50% of 30 min visit spent on counseling and coordination of care.  -US anatomy and cervical length ordered -will start baby ASA -Will start application process for Makena; patient has enough for two more weeks after her self-injection today.  -Patient to see jamie -Base line pre-e labs drawn today [facility] 06/09/2018

## 2018-06-09 NOTE — Patient Instructions (Signed)
Preeclampsia and Eclampsia °Preeclampsia is a serious condition that develops only during pregnancy. It is also called toxemia of pregnancy. This condition causes high blood pressure along with other symptoms, such as swelling and headaches. These symptoms may develop as the condition gets worse. Preeclampsia may occur at 20 weeks of pregnancy or later. °Diagnosing and treating preeclampsia early is very important. If not treated early, it can cause serious problems for you and your baby. One problem it can lead to is eclampsia, which is a condition that causes muscle jerking or shaking (convulsions or seizures) in the mother. Delivering your baby is the best treatment for preeclampsia or eclampsia. Preeclampsia and eclampsia symptoms usually go away after your baby is born. °What are the causes? °The cause of preeclampsia is not known. °What increases the risk? °The following risk factors make you more likely to develop preeclampsia: °· Being pregnant for the first time. °· Having had preeclampsia during a past pregnancy. °· Having a family history of preeclampsia. °· Having high blood pressure. °· Being pregnant with twins or triplets. °· Being 35 or older. °· Being African-American. °· Having kidney disease or diabetes. °· Having medical conditions such as lupus or blood diseases. °· Being very overweight (obese). ° °What are the signs or symptoms? °The earliest signs of preeclampsia are: °· High blood pressure. °· Increased protein in your urine. Your health care provider will check for this at every visit before you give birth (prenatal visit). ° °Other symptoms that may develop as the condition gets worse include: °· Severe headaches. °· Sudden weight gain. °· Swelling of the hands, face, legs, and feet. °· Nausea and vomiting. °· Vision problems, such as blurred or double vision. °· Numbness in the face, arms, legs, and feet. °· Urinating less than usual. °· Dizziness. °· Slurred speech. °· Abdominal pain,  especially upper abdominal pain. °· Convulsions or seizures. ° °Symptoms generally go away after giving birth. °How is this diagnosed? °There are no screening tests for preeclampsia. Your health care provider will ask you about symptoms and check for signs of preeclampsia during your prenatal visits. You may also have tests that include: °· Urine tests. °· Blood tests. °· Checking your blood pressure. °· Monitoring your baby’s heart rate. °· Ultrasound. ° °How is this treated? °You and your health care provider will determine the treatment approach that is best for you. Treatment may include: °· Having more frequent prenatal exams to check for signs of preeclampsia, if you have an increased risk for preeclampsia. °· Bed rest. °· Reducing how much salt (sodium) you eat. °· Medicine to lower your blood pressure. °· Staying in the hospital, if your condition is severe. There, treatment will focus on controlling your blood pressure and the amount of fluids in your body (fluid retention). °· You may need to take medicine (magnesium sulfate) to prevent seizures. This medicine may be given as an injection or through an IV tube. °· Delivering your baby early, if your condition gets worse. You may have your labor started with medicine (induced), or you may have a cesarean delivery. ° °Follow these instructions at home: °Eating and drinking ° °· Drink enough fluid to keep your urine clear or pale yellow. °· Eat a healthy diet that is low in sodium. Do not add salt to your food. Check nutrition labels to see how much sodium a food or beverage contains. °· Avoid caffeine. °Lifestyle °· Do not use any products that contain nicotine or tobacco, such as cigarettes   and e-cigarettes. If you need help quitting, ask your health care provider. °· Do not use alcohol or drugs. °· Avoid stress as much as possible. Rest and get plenty of sleep. °General instructions °· Take over-the-counter and prescription medicines only as told by your  health care provider. °· When lying down, lie on your side. This keeps pressure off of your baby. °· When sitting or lying down, raise (elevate) your feet. Try putting some pillows underneath your lower legs. °· Exercise regularly. Ask your health care provider what kinds of exercise are best for you. °· Keep all follow-up and prenatal visits as told by your health care provider. This is important. °How is this prevented? °To prevent preeclampsia or eclampsia from developing during another pregnancy: °· Get proper medical care during pregnancy. Your health care provider may be able to prevent preeclampsia or diagnose and treat it early. °· Your health care provider may have you take a low-dose aspirin or a calcium supplement during your next pregnancy. °· You may have tests of your blood pressure and kidney function after giving birth. °· Maintain a healthy weight. Ask your health care provider for help managing weight gain during pregnancy. °· Work with your health care provider to manage any long-term (chronic) health conditions you have, such as diabetes or kidney problems. ° °Contact a health care provider if: °· You gain more weight than expected. °· You have headaches. °· You have nausea or vomiting. °· You have abdominal pain. °· You feel dizzy or light-headed. °Get help right away if: °· You develop sudden or severe swelling anywhere in your body. This usually happens in the legs. °· You gain 5 lbs (2.3 kg) or more during one week. °· You have severe: °? Abdominal pain. °? Headaches. °? Dizziness. °? Vision problems. °? Confusion. °? Nausea or vomiting. °· You have a seizure. °· You have trouble moving any part of your body. °· You develop numbness in any part of your body. °· You have trouble speaking. °· You have any abnormal bleeding. °· You pass out. °This information is not intended to replace advice given to you by your health care provider. Make sure you discuss any questions you have with your health  care provider. °Document Released: 09/04/2000 Document Revised: 05/05/2016 Document Reviewed: 04/13/2016 °Elsevier Interactive Patient Education © 2018 [employer]. ° °

## 2018-06-09 NOTE — BH Specialist Note (Signed)
Integrated Behavioral Health Initial Visit  MRN: [id number] Name: Mary Allen  Number of Integrated Behavioral Health Clinician visits:: 1/6 Session Start time: 11:08 Session End time: 11:17 Total time: 15 minutes  Type of Service: Integrated Behavioral Health- Individual/Family Interpretor:No. Interpretor Name and Language: n/a   Warm Hand Off Completed.       SUBJECTIVE: Mary Allen is a 22 y.o. female accompanied by n/a Patient was referred by Luna KitchensKathryn Kooistra, CNM for Initial OB introduction to integrated behavioral health services . Patient reports the following symptoms/concerns: Pt states her only concern is being unfamiliar with the local area; no other concerns today.  Duration of problem: n/a; Severity of problem: n/a  OBJECTIVE: Mood: Normal and Affect: Appropriate Risk of harm to self or others: No plan to harm self or others  LIFE CONTEXT: Family and Social: [place] [facility] [place] [facility] and has a Building surveyor1yo daughter School/Work: - Self-Care: - Life Changes: Current pregnancy; [place] [facility] [place]  INTERVENTIONS: Interventions utilized: Link to [employer]  Standardized Assessments completed: GAD-7 and PHQ 9  ASSESSMENT: Patient currently experiencing Supervision of high risk  pregnancy, antepartum.   Patient may benefit from Initial OB introduction to integrated behavioral health services.  PLAN: 1. Follow up with behavioral health clinician on : As needed 2. Behavioral recommendations:  -Continue taking prenatal pill daily, as recommended by medical provider -Share [facility] with support person -Consider contacting community resources(below) for additional support 3. Referral(s): Integrated Art gallery managerBehavioral Health Services (In Clinic) and [employer] Resources:  [facility], [employer] [facility], [place] reduced fare bus ID, [employer] 4. "From scale of 1-10, how likely are you to follow plan?": 10  Rae LipsJamie  C McMannes, LCSW  Depression screen [facility] 2/[facility] 06/09/2018  Decreased Interest 0  Down, Depressed, Hopeless 0  PHQ - 2 Score 0  Altered sleeping 0  Tired, decreased energy 1  Change in appetite 0  Feeling bad or failure about yourself  0  Trouble concentrating 0  Moving slowly or fidgety/restless 0  Suicidal thoughts 0  PHQ-9 Score 1   GAD 7 : Generalized Anxiety Score 06/09/2018  Nervous, Anxious, on Edge 0  Control/stop worrying 0  Worry too much - different things 2  Trouble relaxing 0  Restless 0  Easily annoyed or irritable 2  Afraid - awful might happen 0  Total GAD 7 Score 4

## 2018-06-10 ENCOUNTER — Telehealth: Payer: Self-pay | Admitting: *Deleted

## 2018-06-10 LAB — COMPREHENSIVE METABOLIC PANEL
ALT: 5 IU/L (ref 0–32)
AST: 11 IU/L (ref 0–40)
Albumin/Globulin Ratio: 1.8 (ref 1.2–2.2)
Albumin: 3.9 g/dL (ref 3.5–5.5)
Alkaline Phosphatase: 66 IU/L (ref 39–117)
BUN/Creatinine Ratio: 13 (ref 9–23)
BUN: 7 mg/dL (ref 6–20)
Bilirubin Total: 0.2 mg/dL (ref 0.0–1.2)
CALCIUM: 9.1 mg/dL (ref 8.7–10.2)
CO2: 22 mmol/L (ref 20–29)
Chloride: 102 mmol/L (ref 96–106)
Creatinine, Ser: 0.52 mg/dL — ABNORMAL LOW (ref 0.57–1.00)
GFR calc Af Amer: 155 mL/min/{1.73_m2} (ref >59)
GFR, EST NON AFRICAN AMERICAN: 134 mL/min/{1.73_m2} (ref >59)
GLUCOSE: 73 mg/dL (ref 65–99)
Globulin, Total: 2.2 g/dL (ref 1.5–4.5)
Potassium: 4.2 mmol/L (ref 3.5–5.2)
Sodium: 139 mmol/L (ref 134–144)
TOTAL PROTEIN: 6.1 g/dL (ref 6.0–8.5)

## 2018-06-10 LAB — CBC
HEMATOCRIT: 34.1 % (ref 34.0–46.6)
HEMOGLOBIN: 11.4 g/dL (ref 11.1–15.9)
MCH: 31.1 pg (ref 26.6–33.0)
MCHC: 33.4 g/dL (ref 31.5–35.7)
MCV: 93 fL (ref 79–97)
Platelets: 251 10*3/uL (ref 150–450)
RBC: 3.66 x10E6/uL — AB (ref 3.77–5.28)
RDW: 13 % (ref 12.3–15.4)
WBC: 9.7 10*3/uL (ref 3.4–10.8)

## 2018-06-10 LAB — PROTEIN / CREATININE RATIO, URINE
Creatinine, Urine: 142.3 mg/dL
PROTEIN UR: 13.5 mg/dL
PROTEIN/CREAT RATIO: 95 mg/g{creat} (ref 0–200)

## 2018-06-10 LAB — TSH: TSH: 1.07 u[IU]/mL (ref 0.450–4.500)

## 2018-06-10 NOTE — Telephone Encounter (Signed)
[facility] called to inform us that [facility] would not cover Makena injections. Called Makena rep at [phone] ext [phone] and left a message for her to call us back regarding possible coverage through free assistance program.

## 2018-06-13 NOTE — Telephone Encounter (Signed)
Called and left message with [facility] again.

## 2018-06-14 NOTE — Telephone Encounter (Signed)
[facility] to [facility] for clarification.

## 2018-06-15 ENCOUNTER — Ambulatory Visit (HOSPITAL_COMMUNITY)
Admission: RE | Admit: 2018-06-15 | Discharge: 2018-06-15 | Disposition: A | Discharge status: Home / Self Care | Payer: Medicaid - Out of State | Source: Ambulatory Visit | Attending: Student | Admitting: Student

## 2018-06-15 ENCOUNTER — Other Ambulatory Visit (HOSPITAL_COMMUNITY): Payer: Self-pay | Admitting: *Deleted

## 2018-06-15 ENCOUNTER — Encounter (HOSPITAL_COMMUNITY): Payer: Self-pay

## 2018-06-15 DIAGNOSIS — O09212 Supervision of pregnancy with history of pre-term labor, second trimester: Secondary | ICD-10-CM | POA: Diagnosis present

## 2018-06-15 DIAGNOSIS — Z3686 Encounter for antenatal screening for cervical length: Secondary | ICD-10-CM | POA: Diagnosis not present

## 2018-06-15 DIAGNOSIS — O3432 Maternal care for cervical incompetence, second trimester: Secondary | ICD-10-CM | POA: Diagnosis not present

## 2018-06-15 DIAGNOSIS — Z3A22 22 weeks gestation of pregnancy: Secondary | ICD-10-CM | POA: Insufficient documentation

## 2018-06-15 DIAGNOSIS — Z362 Encounter for other antenatal screening follow-up: Secondary | ICD-10-CM

## 2018-06-15 DIAGNOSIS — Z363 Encounter for antenatal screening for malformations: Secondary | ICD-10-CM

## 2018-06-15 DIAGNOSIS — O139 Gestational [pregnancy-induced] hypertension without significant proteinuria, unspecified trimester: Secondary | ICD-10-CM

## 2018-06-15 DIAGNOSIS — O26872 Cervical shortening, second trimester: Secondary | ICD-10-CM | POA: Insufficient documentation

## 2018-06-15 DIAGNOSIS — O0990 Supervision of high risk pregnancy, unspecified, unspecified trimester: Secondary | ICD-10-CM

## 2018-06-15 DIAGNOSIS — O09292 Supervision of pregnancy with other poor reproductive or obstetric history, second trimester: Secondary | ICD-10-CM | POA: Insufficient documentation

## 2018-06-16 ENCOUNTER — Encounter: Payer: Self-pay | Admitting: General Practice

## 2018-06-16 NOTE — Progress Notes (Unsigned)
17p application completed & faxed to [facility]- patient will be eligible for free assistance if insurance does not cover since she has already begun therapy.

## 2018-06-29 ENCOUNTER — Telehealth: Payer: Self-pay | Admitting: General Practice

## 2018-06-29 NOTE — Telephone Encounter (Signed)
Patient called and left message on nurse voicemail line stating her pharmacy is trying to reach [place] regarding her makena prescription.

## 2018-06-30 ENCOUNTER — Encounter: Payer: Self-pay | Admitting: *Deleted

## 2018-06-30 NOTE — Telephone Encounter (Signed)
Called patient stating I am trying to reach you to return your phone call. Patient states her [employer] has been trying to reach our office for delivery of her Cruzita Lederer, which they will be covering. Told patient we haven't received anything from them & advised her to call them and make sure they have our correct contact information so we can receive her medication. Patient verbalized understanding & had no questions.

## 2018-07-01 ENCOUNTER — Encounter: Payer: 59 | Admitting: Obstetrics and Gynecology

## 2018-07-04 ENCOUNTER — Ambulatory Visit (INDEPENDENT_AMBULATORY_CARE_PROVIDER_SITE_OTHER): Payer: 59 | Admitting: Obstetrics & Gynecology

## 2018-07-04 ENCOUNTER — Encounter: Payer: Self-pay | Admitting: Obstetrics & Gynecology

## 2018-07-04 VITALS — BP 122/60 | HR 102 | Wt 155.9 lb

## 2018-07-04 DIAGNOSIS — Z8751 Personal history of pre-term labor: Secondary | ICD-10-CM

## 2018-07-04 DIAGNOSIS — O26872 Cervical shortening, second trimester: Secondary | ICD-10-CM

## 2018-07-04 DIAGNOSIS — Z23 Encounter for immunization: Secondary | ICD-10-CM | POA: Diagnosis not present

## 2018-07-04 DIAGNOSIS — O09212 Supervision of pregnancy with history of pre-term labor, second trimester: Secondary | ICD-10-CM

## 2018-07-04 DIAGNOSIS — Z8759 Personal history of other complications of pregnancy, childbirth and the puerperium: Secondary | ICD-10-CM

## 2018-07-04 DIAGNOSIS — O0992 Supervision of high risk pregnancy, unspecified, second trimester: Secondary | ICD-10-CM

## 2018-07-04 DIAGNOSIS — O0990 Supervision of high risk pregnancy, unspecified, unspecified trimester: Secondary | ICD-10-CM

## 2018-07-04 NOTE — Progress Notes (Signed)
   PRENATAL VISIT NOTE  Subjective:  Mary Allen is a 22 y.o. [phone] at [redacted]w[redacted]d being seen today for ongoing prenatal care.  She is currently monitored for the following issues for this high-risk pregnancy and has Short cervix during pregnancy in second trimester; Supervision of high risk pregnancy, antepartum; H/O pre-term labor; and History of gestational hypertension on their problem list.  Patient reports no complaints.  Contractions: Not present. Vag. Bleeding: None.  Movement: Present. Denies leaking of fluid.   The following portions of the patient's history were reviewed and updated as appropriate: allergies, current medications, past family history, past medical history, past social history, past surgical history and problem list. Problem list updated.  Objective:   Vitals:   07/04/18 1440  BP: 122/60  Pulse: (!) 102  Weight: 155 lb 14.4 oz (70.7 kg)    Fetal Status: Fetal Heart Rate (bpm): 142   Movement: Present     General:  Alert, oriented and cooperative. Patient is in no acute distress.  Skin: Skin is warm and dry. No rash noted.   Cardiovascular: Normal heart rate noted  Respiratory: Normal respiratory effort, no problems with respiration noted  Abdomen: Soft, gravid, appropriate for gestational age.  Pain/Pressure: Present     Pelvic: Cervical exam deferred        Extremities: Normal range of motion.  Edema: None  Mental Status: Normal mood and affect. Normal behavior. Normal judgment and thought content.   Assessment and Plan:  Pregnancy: [id number] at [redacted]w[redacted]d  1. Supervision of high risk pregnancy, antepartum Flu shot today  2. Short cervix during pregnancy in second trimester No s/sx of PTL Cerclage in palace 05/05/2018  3. History of gestational hypertension Daily baby ASA  4. H/O pre-term labor Was on [facility]-OH-P. [facility] Took [facility] injection 110/01/2018  Pts meds were sent to her prev doc ofc.     Preterm labor symptoms and general  obstetric precautions including but not limited to vaginal bleeding, contractions, leaking of fluid and fetal movement were reviewed in detail with the patient. Please refer to After Visit Summary for other counseling recommendations.  Return in about 4 weeks (around 08/01/2018).  Future Appointments  Date Time Provider Department Center  07/13/2018 11:15 AM WH-MFC [place] 4 WH-MFCUS MFC-US    Willodean Rosenthal, MD

## 2018-07-04 NOTE — Progress Notes (Signed)
Pt called insurance company who reported that they shipped the [facility] to her old MD office and they would speak with their supervisor to find out how to get it shipped to our office quickly.  Pt said she will call this office no later than tomorrow to inform us of the results of that phone call.

## 2018-07-04 NOTE — Addendum Note (Signed)
Addended by: Faythe Casa on: 07/04/2018 04:21 PM   Modules accepted: Orders

## 2018-07-05 ENCOUNTER — Telehealth: Payer: Self-pay

## 2018-07-05 NOTE — Telephone Encounter (Signed)
Pt called and stated that she was informed that the medication will be in tomorrow could someone give her a call back.  LM for pt that I am returning her call and that I am sending a MyChart message.

## 2018-07-13 ENCOUNTER — Encounter (HOSPITAL_COMMUNITY): Payer: Self-pay

## 2018-07-13 ENCOUNTER — Ambulatory Visit (HOSPITAL_COMMUNITY)
Admission: RE | Admit: 2018-07-13 | Discharge: 2018-07-13 | Disposition: A | Discharge status: Home / Self Care | Payer: Medicaid - Out of State | Source: Ambulatory Visit | Attending: Student | Admitting: Student

## 2018-07-13 DIAGNOSIS — Z362 Encounter for other antenatal screening follow-up: Secondary | ICD-10-CM | POA: Diagnosis not present

## 2018-07-13 DIAGNOSIS — O3432 Maternal care for cervical incompetence, second trimester: Secondary | ICD-10-CM

## 2018-07-13 DIAGNOSIS — O09292 Supervision of pregnancy with other poor reproductive or obstetric history, second trimester: Secondary | ICD-10-CM | POA: Diagnosis not present

## 2018-07-13 DIAGNOSIS — O09212 Supervision of pregnancy with history of pre-term labor, second trimester: Secondary | ICD-10-CM

## 2018-07-13 DIAGNOSIS — Z363 Encounter for antenatal screening for malformations: Secondary | ICD-10-CM

## 2018-07-13 DIAGNOSIS — Z3A26 26 weeks gestation of pregnancy: Secondary | ICD-10-CM

## 2018-07-13 DIAGNOSIS — O26872 Cervical shortening, second trimester: Secondary | ICD-10-CM

## 2018-07-19 ENCOUNTER — Telehealth: Payer: Self-pay

## 2018-07-19 NOTE — Telephone Encounter (Addendum)
Received a call from Windell Moulding, nurse from [facility] @ [phone], and was informed that they would not be able to send pt's 17p medication to our office.  Windell Moulding also had Aundra Millet from speciality pharmacy on line who informed me that they had spoken to the pt on 06/28/18 and the pt confirmed that the shipment address of the medication should go to [facility]. Windell Moulding informed me that the pt has not been seen since 05/26/18 and that the pt did not even inform them that she as transferring.  Windell Moulding also stated that it was a liability issue if they were to send the medication to our office however [facility] was advised to discard the medication.  Windell Moulding continued to inform me that she received information from [place], [place],  that the pt needs to call and [phone] and give them an updated address and current shipment address.  Windell Moulding stated that if the pt does this she will be able to get an test run of medication.  LM for pt to please give the office a call asap its concerning her medication.

## 2018-07-26 NOTE — Telephone Encounter (Signed)
I have attempted several times to get in touch with someone at the pharmacy @ [phone] for pt's Makena. Every time I get a hold of someone in the pharmacy the call gets disconnected.  Will attempt again.  If can not get a hold of someone pt has an appt on 08/01/18 will f/u at that time.

## 2018-08-01 ENCOUNTER — Ambulatory Visit (INDEPENDENT_AMBULATORY_CARE_PROVIDER_SITE_OTHER): Payer: 59 | Admitting: Family Medicine

## 2018-08-01 ENCOUNTER — Encounter: Payer: Self-pay | Admitting: Family Medicine

## 2018-08-01 VITALS — BP 118/63 | HR 88 | Wt 161.9 lb

## 2018-08-01 DIAGNOSIS — Z8751 Personal history of pre-term labor: Secondary | ICD-10-CM

## 2018-08-01 DIAGNOSIS — Z8759 Personal history of other complications of pregnancy, childbirth and the puerperium: Secondary | ICD-10-CM

## 2018-08-01 DIAGNOSIS — Z23 Encounter for immunization: Secondary | ICD-10-CM

## 2018-08-01 DIAGNOSIS — O26873 Cervical shortening, third trimester: Secondary | ICD-10-CM

## 2018-08-01 DIAGNOSIS — R109 Unspecified abdominal pain: Secondary | ICD-10-CM

## 2018-08-01 DIAGNOSIS — O26872 Cervical shortening, second trimester: Secondary | ICD-10-CM

## 2018-08-01 DIAGNOSIS — Z113 Encounter for screening for infections with a predominantly sexual mode of transmission: Secondary | ICD-10-CM

## 2018-08-01 DIAGNOSIS — O26893 Other specified pregnancy related conditions, third trimester: Secondary | ICD-10-CM

## 2018-08-01 DIAGNOSIS — O26899 Other specified pregnancy related conditions, unspecified trimester: Secondary | ICD-10-CM

## 2018-08-01 DIAGNOSIS — B373 Candidiasis of vulva and vagina: Secondary | ICD-10-CM

## 2018-08-01 DIAGNOSIS — O0990 Supervision of high risk pregnancy, unspecified, unspecified trimester: Secondary | ICD-10-CM | POA: Insufficient documentation

## 2018-08-01 DIAGNOSIS — N898 Other specified noninflammatory disorders of vagina: Secondary | ICD-10-CM

## 2018-08-01 DIAGNOSIS — O0993 Supervision of high risk pregnancy, unspecified, third trimester: Secondary | ICD-10-CM

## 2018-08-01 MED ORDER — COMFORT FIT MATERNITY SUPP MED MISC: 0 refills | Status: DC

## 2018-08-01 NOTE — Progress Notes (Signed)
   PRENATAL VISIT NOTE Subjective:  Mary Allen is a 22 y.o. [phone] at [redacted]w[redacted]d being seen today for ongoing prenatal care.  She is currently monitored for the following issues for this high-risk pregnancy and has Short cervix during pregnancy in second trimester; Short interval between pregnancies affecting pregnancy, antepartum; H/O pre-term labor; History of gestational hypertension; and Supervision of high risk pregnancy, antepartum on their problem list.  Patient reports backache, vaginal irritation and cramping.  Contractions: Irritability. Vag. Bleeding: None.  Movement: Present. Denies leaking of fluid.   The following portions of the patient's history were reviewed and updated as appropriate: allergies, current medications, past family history, past medical history, past social history, past surgical history and problem list. Problem list updated.  Objective:   Vitals:   08/01/18 0921  BP: 118/63  Pulse: 88  Weight: 161 lb 14.4 oz (73.4 kg)    Fetal Status: Fetal Heart Rate (bpm): 140 Fundal Height: 29 cm Movement: Present     General:  Alert, oriented and cooperative. Patient is in no acute distress.  Skin: Skin is warm and dry. No rash noted.   Cardiovascular: Normal heart rate noted  Respiratory: Normal respiratory effort, no problems with respiration noted  Abdomen: Soft, gravid, appropriate for gestational age.  Pain/Pressure: Present     Pelvic: Cervical exam performed        moderate white cheesy discharge, cerclage in place and cervix appears closed  No amniotic sac visualized and no evidence of cerclage tearing through cervix   Extremities: Normal range of motion.  Edema: Trace  Mental Status: Normal mood and affect. Normal behavior. Normal judgment and thought content.   Assessment and Plan:  Pregnancy: [id number] at [redacted]w[redacted]d  1. Supervision of high risk pregnancy, antepartum  - Obstetric Panel, Including HIV - Glucose Tolerance, 2 Hours w/1 Hour - CBC - RPR -  Tdap vaccine greater than or equal to 7yo IM  2. History of gestational hypertension: BP wnl, asymptomatic (no headaches, vision changes, RUQ pain) BP Readings from Last 3 Encounters:  08/01/18 118/63  07/13/18 120/64  07/04/18 122/60  - cont to monitor at subsequent visits - continue aspirin daily until 37 weeks   3. H/O pre-term labor 4. Short cervix during pregnancy in second trimester Cerclage in place. Pulling/pressure sensation most likely 2/2 round ligament stretching +/- candidal or BV irritation  - Cerclage in place without tension on speculum exam. Cervix is closed with no amniotic sac visualized. - Makena - awaiting delivery, not available at today's visit   5. Vaginal Discharge Seen on exam; Candidiasis vs BV given appearance.   - Swabbed for gc/bv/trich/candida  5. Back Pain -- Rx maternity support band   Preterm labor symptoms and general obstetric precautions including but not limited to vaginal bleeding, contractions, leaking of fluid and fetal movement were reviewed in detail with the patient.  Please refer to After Visit Summary for other counseling recommendations.  Return in about 2 weeks (around 08/15/2018) for HROB.  Future Appointments  Date Time Provider Department Center  08/16/2018  2:15 PM [place], [place], CNM [facility]    [place]. Earlene Plater, DO OB/GYN Fellow

## 2018-08-01 NOTE — Patient Instructions (Addendum)
   We will call you with the results of your labs and swab collected during the visit  We will also call you regarding your injections to help prevent preterm birth   PREGNANCY SUPPORT BELT: You are not alone, Seventy-five percent of women have some sort of abdominal or back pain at some point in their pregnancy. Your baby is growing at a fast pace, which means that your whole body is rapidly trying to adjust to the changes. As your uterus grows, your back may start feeling a bit under stress and this can result in back or abdominal pain that can go from mild, and therefore bearable, to severe pains that will not allow you to sit or lay down comfortably, When it comes to dealing with pregnancy-related pains and cramps, some pregnant women usually prefer natural remedies, which the market is filled with nowadays. For example, wearing a pregnancy support belt can help ease and lessen your discomfort and pain. WHAT ARE THE BENEFITS OF WEARING A PREGNANCY SUPPORT BELT? A pregnancy support belt provides support to the lower portion of the belly taking some of the weight of the growing uterus and distributing to the other parts of your body. It is designed make you comfortable and gives you extra support. Over the years, the pregnancy apparel market has been studying the needs and wants of pregnant women and they have come up with the most comfortable pregnancy support belts that woman could ever ask for. In fact, you will no longer have to wear a stretched-out or bulky pregnancy belt that is visible underneath your clothes and makes you feel even more uncomfortable. Nowadays, a pregnancy support belt is made of comfortable and stretchy materials that will not irritate your skin but will actually make you feel at ease and you will not even notice you are wearing it. They are easy to put on and adjust during the day and can be worn at night for additional support.  BENEFITS: . Relives Back pain . Relieves  Abdominal Muscle and Leg Pain . Stabilizes the Pelvic Ring . Offers a Cushioned Abdominal Lift Pad . Relieves pressure on the Sciatic Nerve Within Minutes WHERE TO GET YOUR PREGNANCY BELT: [employer] [phone] @[address]01  [address] [place], [place] [address]

## 2018-08-02 ENCOUNTER — Telehealth: Payer: Self-pay | Admitting: General Practice

## 2018-08-02 LAB — OBSTETRIC PANEL, INCLUDING HIV
Antibody Screen: NEGATIVE
BASOS ABS: 0 10*3/uL (ref 0.0–0.2)
Basos: 0 %
EOS (ABSOLUTE): 0.1 10*3/uL (ref 0.0–0.4)
Eos: 1 %
HEP B S AG: NEGATIVE
HIV Screen 4th Generation wRfx: NONREACTIVE
Hematocrit: 31.6 % — ABNORMAL LOW (ref 34.0–46.6)
Hemoglobin: 10.9 g/dL — ABNORMAL LOW (ref 11.1–15.9)
IMMATURE GRANULOCYTES: 1 %
Immature Grans (Abs): 0.1 10*3/uL (ref 0.0–0.1)
LYMPHS: 14 %
Lymphocytes Absolute: 1.6 10*3/uL (ref 0.7–3.1)
MCH: 30.9 pg (ref 26.6–33.0)
MCHC: 34.5 g/dL (ref 31.5–35.7)
MCV: 90 fL (ref 79–97)
MONOCYTES: 9 %
Monocytes Absolute: 1 10*3/uL — ABNORMAL HIGH (ref 0.1–0.9)
NEUTROS PCT: 75 %
Neutrophils Absolute: 8.4 10*3/uL — ABNORMAL HIGH (ref 1.4–7.0)
PLATELETS: 207 10*3/uL (ref 150–450)
RBC: 3.53 x10E6/uL — ABNORMAL LOW (ref 3.77–5.28)
RDW: 12.6 % (ref 12.3–15.4)
RPR: NONREACTIVE
RUBELLA: 3.08 {index} (ref >0.99)
Rh Factor: POSITIVE
WBC: 11.2 10*3/uL — AB (ref 3.4–10.8)

## 2018-08-02 LAB — CERVICOVAGINAL ANCILLARY ONLY
Bacterial vaginitis: NEGATIVE
CANDIDA VAGINITIS: POSITIVE — AB
CHLAMYDIA, DNA PROBE: NEGATIVE
Neisseria Gonorrhea: NEGATIVE
Trichomonas: NEGATIVE

## 2018-08-02 LAB — GLUCOSE TOLERANCE, 2 HOURS W/ 1HR
GLUCOSE, 2 HOUR: 108 mg/dL (ref 65–152)
Glucose, 1 hour: 106 mg/dL (ref 65–179)
Glucose, Fasting: 73 mg/dL (ref 65–91)

## 2018-08-02 NOTE — Telephone Encounter (Signed)
[facility] called and left message on nurse voicemail line requesting a call back regarding a patient's medication. [facility] and they state they are unable to ship [place] the medication directly without authorization from the patient and the previous OB/GYN provider. They state once they hear back from the patient and the previous OB provider, they will call us back with an update.

## 2018-08-03 ENCOUNTER — Telehealth: Payer: Self-pay | Admitting: General Practice

## 2018-08-03 NOTE — Telephone Encounter (Signed)
Contacted [phone] received       reference # [id number] in regards to receiving pt's 17p.  Was informed that they are [facility] [place] and do not do [place].  I informed that rep that I will consult with our staff member who has faxed over a new medication.  Spoke with Lyla SonCarrie who will be taking care of pt's Makena.

## 2018-08-03 NOTE — Telephone Encounter (Signed)
08/02/18 @ 1600 [facility] that they left a message for the patient's previous OB provider to obtain authorization for the release of the prescription to us & they have spoken to the patient as well. [facility] recommending we send a new prescription as well in the meantime. They need the patient's name, DOB, address, medication instructions and her [employer] ID on the prescription faxed to them.   08/03/18 @1315  Rx obtained and faxed to [phone].

## 2018-08-09 ENCOUNTER — Other Ambulatory Visit: Payer: Self-pay | Admitting: Family Medicine

## 2018-08-09 DIAGNOSIS — B379 Candidiasis, unspecified: Secondary | ICD-10-CM

## 2018-08-09 MED ORDER — TERCONAZOLE 0.8 % VA CREA: 1.0000 | TOPICAL_CREAM | Freq: Every day | VAGINAL | 0 refills | Status: DC

## 2018-08-15 ENCOUNTER — Telehealth: Payer: Self-pay | Admitting: *Deleted

## 2018-08-15 ENCOUNTER — Other Ambulatory Visit: Payer: Self-pay | Admitting: *Deleted

## 2018-08-15 DIAGNOSIS — B379 Candidiasis, unspecified: Secondary | ICD-10-CM

## 2018-08-15 MED ORDER — TERCONAZOLE 0.8 % VA CREA: 1.0000 | TOPICAL_CREAM | Freq: Every day | VAGINAL | 0 refills | Status: AC

## 2018-08-15 NOTE — Telephone Encounter (Signed)
[facility] [place] that she is calling to get clarification for order sent in by e-prescribe- for terconazole. States order states 7 nights but was ordered as 0.8%, states 0.4% is for 7 nights. Requests a clarification call back. Called back and states doctor wants treatment for 7 nights so please order 0.4%

## 2018-08-16 ENCOUNTER — Ambulatory Visit (INDEPENDENT_AMBULATORY_CARE_PROVIDER_SITE_OTHER): Payer: 59 | Admitting: Obstetrics and Gynecology

## 2018-08-16 VITALS — BP 114/60 | HR 102 | Wt 164.2 lb

## 2018-08-16 DIAGNOSIS — Z3A31 31 weeks gestation of pregnancy: Secondary | ICD-10-CM

## 2018-08-16 DIAGNOSIS — O09293 Supervision of pregnancy with other poor reproductive or obstetric history, third trimester: Secondary | ICD-10-CM

## 2018-08-16 DIAGNOSIS — O0990 Supervision of high risk pregnancy, unspecified, unspecified trimester: Secondary | ICD-10-CM

## 2018-08-16 DIAGNOSIS — B373 Candidiasis of vulva and vagina: Secondary | ICD-10-CM

## 2018-08-16 DIAGNOSIS — O3433 Maternal care for cervical incompetence, third trimester: Secondary | ICD-10-CM

## 2018-08-16 DIAGNOSIS — Z8759 Personal history of other complications of pregnancy, childbirth and the puerperium: Secondary | ICD-10-CM

## 2018-08-16 DIAGNOSIS — B3731 Acute candidiasis of vulva and vagina: Secondary | ICD-10-CM

## 2018-08-16 DIAGNOSIS — O3430 Maternal care for cervical incompetence, unspecified trimester: Secondary | ICD-10-CM | POA: Insufficient documentation

## 2018-08-16 NOTE — Progress Notes (Signed)
Prenatal Visit Note Date: 08/16/2018 Clinic: [facility]  Subjective:  Mary Allen is a 22 y.o. [phone] at 6093w4d being seen today for ongoing prenatal care.  She is currently monitored for the following issues for this high-risk pregnancy and has Short cervix during pregnancy in second trimester; Short interval between pregnancies affecting pregnancy, antepartum; H/O pre-term labor; History of gestational hypertension; Supervision of high risk pregnancy, antepartum; and Cervical cerclage suture present on their problem list.  Patient reports pelvic pain and pressure since last visit. no VB.   Contractions: Irritability. Vag. Bleeding: None.  Movement: Present. Denies leaking of fluid.   The following portions of the patient's history were reviewed and updated as appropriate: allergies, current medications, past family history, past medical history, past social history, past surgical history and problem list. Problem list updated.  Objective:   Vitals:   08/16/18 1434  BP: 114/60  Pulse: (!) 102  Weight: 164 lb 3.2 oz (74.5 kg)    Fetal Status: Fetal Heart Rate (bpm): 145 Fundal Height: 32 cm Movement: Present     General:  Alert, oriented and cooperative. Patient is in no acute distress.  Skin: Skin is warm and dry. No rash noted.   Cardiovascular: Normal heart rate noted  Respiratory: Normal respiratory effort, no problems with respiration noted  Abdomen: Soft, gravid, appropriate for gestational age. Pain/Pressure: Present     Pelvic:  Cervical exam performed Dilation: Closed Effacement (%): Thick Station: Ballotable  EGBUS large b/l area with erythema, dryness c/w yeast infection. No blood in vault but copious amounts of white cottage cheese like d/c in vault. Cervix visually closed and no tension on stitch. Stitch normal at 12-1 o'clock  Extremities: Normal range of motion.  Edema: None  Mental Status: Normal mood and affect. Normal behavior. Normal  judgment and thought content.   Urinalysis:      Assessment and Plan:  Pregnancy: [id number] at 10293w4d  1. Vulvovaginal candidiasis Pt started the terazol today and told to put some on the outside too b/c it's a pretty bad infection and to use for 2wks  2. Supervision of high risk pregnancy, antepartum Routine care. nexplanon  3. History of gestational hypertension Continue low dose asa  4. Cervical cerclage suture present in third trimester Not getting 17p. Cerclage looks great. Remove at 36-37wks  Preterm labor symptoms and general obstetric precautions including but not limited to vaginal bleeding, contractions, leaking of fluid and fetal movement were reviewed in detail with the patient. Please refer to After Visit Summary for other counseling recommendations.  Return in about 10 days (around 08/26/2018) for 10-14d rob.   [place] BingPickens, Thessaly Mccullers, MD

## 2018-08-16 NOTE — Progress Notes (Signed)
Pt states is having pain in her Cerclage area & cramping.

## 2018-08-29 ENCOUNTER — Ambulatory Visit (INDEPENDENT_AMBULATORY_CARE_PROVIDER_SITE_OTHER): Payer: 59 | Admitting: Obstetrics & Gynecology

## 2018-08-29 VITALS — BP 125/59 | HR 108 | Wt 168.1 lb

## 2018-08-29 DIAGNOSIS — Z8759 Personal history of other complications of pregnancy, childbirth and the puerperium: Secondary | ICD-10-CM

## 2018-08-29 DIAGNOSIS — O0990 Supervision of high risk pregnancy, unspecified, unspecified trimester: Secondary | ICD-10-CM

## 2018-08-29 DIAGNOSIS — O09899 Supervision of other high risk pregnancies, unspecified trimester: Secondary | ICD-10-CM

## 2018-08-29 DIAGNOSIS — O26872 Cervical shortening, second trimester: Secondary | ICD-10-CM

## 2018-08-29 DIAGNOSIS — O3433 Maternal care for cervical incompetence, third trimester: Secondary | ICD-10-CM

## 2018-08-31 ENCOUNTER — Telehealth: Payer: Self-pay

## 2018-08-31 NOTE — Telephone Encounter (Signed)
[facility] in regards to scheduling delivery for pt's Makena. Contacted [employer] and was informed that they will send her medication to our office on Thursday 09/01/18.

## 2018-09-05 ENCOUNTER — Telehealth: Payer: Self-pay | Admitting: *Deleted

## 2018-09-05 NOTE — Telephone Encounter (Signed)
Noted on chart review was late on getting 17p. Called patient and she agreed to come in tomorrow.

## 2018-09-07 ENCOUNTER — Telehealth: Payer: Self-pay | Admitting: Family Medicine

## 2018-09-07 NOTE — Telephone Encounter (Signed)
Spoke w/ pt to get her scheduled for her 17p injections, Pt expressed that she no longer wanted to get the injections. She stated that she is suppose to stop getting them at 36 weeks any ways.

## 2018-09-12 ENCOUNTER — Ambulatory Visit (INDEPENDENT_AMBULATORY_CARE_PROVIDER_SITE_OTHER): Payer: 59 | Admitting: Family Medicine

## 2018-09-12 VITALS — BP 127/66 | HR 100 | Wt 171.4 lb

## 2018-09-12 DIAGNOSIS — O0993 Supervision of high risk pregnancy, unspecified, third trimester: Secondary | ICD-10-CM

## 2018-09-12 DIAGNOSIS — O3433 Maternal care for cervical incompetence, third trimester: Secondary | ICD-10-CM

## 2018-09-12 DIAGNOSIS — Z8759 Personal history of other complications of pregnancy, childbirth and the puerperium: Secondary | ICD-10-CM

## 2018-09-12 DIAGNOSIS — Z8751 Personal history of pre-term labor: Secondary | ICD-10-CM

## 2018-09-12 DIAGNOSIS — O26872 Cervical shortening, second trimester: Secondary | ICD-10-CM

## 2018-09-12 DIAGNOSIS — O0990 Supervision of high risk pregnancy, unspecified, unspecified trimester: Secondary | ICD-10-CM

## 2018-09-12 NOTE — Progress Notes (Signed)
   PRENATAL VISIT NOTE  Subjective:  Mary Allen is a 22 y.o. [phone] at 6050w3d being seen today for ongoing prenatal care.  She is currently monitored for the following issues for this high-risk pregnancy and has Short cervix during pregnancy in second trimester; Short interval between pregnancies affecting pregnancy, antepartum; H/O pre-term labor; History of gestational hypertension; Supervision of high risk pregnancy, antepartum; and Cervical cerclage suture present on their problem list.  Patient reports occasional contractions.  Contractions: Irregular. Vag. Bleeding: None.  Movement: Present. Denies leaking of fluid.   The following portions of the patient's history were reviewed and updated as appropriate: allergies, current medications, past family history, past medical history, past social history, past surgical history and problem list. Problem list updated.  Objective:   Vitals:   09/12/18 0926  BP: 127/66  Pulse: 100  Weight: 171 lb 6.4 oz (77.7 kg)    Fetal Status: Fetal Heart Rate (bpm): 139 Fundal Height: 36 cm Movement: Present  Presentation: Vertex  General:  Alert, oriented and cooperative. Patient is in no acute distress.  Skin: Skin is warm and dry. No rash noted.   Cardiovascular: Normal heart rate noted  Respiratory: Normal respiratory effort, no problems with respiration noted  Abdomen: Soft, gravid, appropriate for gestational age.  Pain/Pressure: Present     Pelvic: Cervical exam deferred        Extremities: Normal range of motion.  Edema: None  Mental Status: Normal mood and affect. Normal behavior. Normal judgment and thought content.   Assessment and Plan:  Pregnancy: [id number] at 6050w3d  1. Supervision of high risk pregnancy, antepartum FHT and FH normal  2. Short cervix during pregnancy in second trimester  3. Cervical cerclage suture present in third trimester Remove next visit with 36 wk cultures  4. H/O pre-term labor  5. History of  gestational hypertension Continue ASA  Preterm labor symptoms and general obstetric precautions including but not limited to vaginal bleeding, contractions, leaking of fluid and fetal movement were reviewed in detail with the patient. Please refer to After Visit Summary for other counseling recommendations.  No follow-ups on file.  Future Appointments  Date Time Provider Department Center  09/19/2018  9:15 AM Noralee CharsStinson, Ashlyn Cabler J, DO [facility]  09/26/2018  9:15 AM Adam PhenixArnold, James G, MD [facility]  10/03/2018  9:15 AM [place] BingPickens, Charlie, MD W[facility] WOC  10/10/2018  9:15 AM Levie HeritageStinson, [place] Carmack J, DO WOC-[facility] WOC  10/17/2018  9:15 AM Adrian BlackwaterStinson, Rhona RaiderJacob J, DO WOC-WOCA WOC    Levie HeritageJacob J Broden Holt, DO

## 2018-09-19 ENCOUNTER — Ambulatory Visit (INDEPENDENT_AMBULATORY_CARE_PROVIDER_SITE_OTHER): Payer: 59 | Admitting: Family Medicine

## 2018-09-19 ENCOUNTER — Encounter: Payer: Self-pay | Admitting: Family Medicine

## 2018-09-19 VITALS — BP 126/59 | HR 88 | Wt 171.6 lb

## 2018-09-19 DIAGNOSIS — O3430 Maternal care for cervical incompetence, unspecified trimester: Secondary | ICD-10-CM

## 2018-09-19 DIAGNOSIS — O26872 Cervical shortening, second trimester: Secondary | ICD-10-CM

## 2018-09-19 DIAGNOSIS — Z113 Encounter for screening for infections with a predominantly sexual mode of transmission: Secondary | ICD-10-CM | POA: Diagnosis not present

## 2018-09-19 LAB — OB RESULTS CONSOLE GC/CHLAMYDIA: Gonorrhea: NEGATIVE

## 2018-09-19 LAB — OB RESULTS CONSOLE GBS: STREP GROUP B AG: NEGATIVE

## 2018-09-19 NOTE — Progress Notes (Signed)
   PRENATAL VISIT NOTE  Subjective:  Mary Allen is a 22 y.o. [phone] at 4151w3d being seen today for ongoing prenatal care.  She is currently monitored for the following issues for this high-risk pregnancy and has Short cervix during pregnancy in second trimester; Short interval between pregnancies affecting pregnancy, antepartum; H/O pre-term labor; History of gestational hypertension; Supervision of high risk pregnancy, antepartum; and Cervical cerclage suture present on their problem list.  Patient reports no complaints.  Contractions: Irregular. Vag. Bleeding: None.  Movement: Present. Denies leaking of fluid.   The following portions of the patient's history were reviewed and updated as appropriate: allergies, current medications, past family history, past medical history, past social history, past surgical history and problem list. Problem list updated.  Objective:   Vitals:   09/19/18 0926  BP: (!) 126/59  Pulse: 88  Weight: 171 lb 9.6 oz (77.8 kg)    Fetal Status: Fetal Heart Rate (bpm): 147 Fundal Height: 37 cm Movement: Present  Presentation: Vertex  General:  Alert, oriented and cooperative. Patient is in no acute distress.  Skin: Skin is warm and dry. No rash noted.   Cardiovascular: Normal heart rate noted  Respiratory: Normal respiratory effort, no problems with respiration noted  Abdomen: Soft, gravid, appropriate for gestational age.  Pain/Pressure: Present     Pelvic: Cervical exam performed Dilation: 1 Effacement (%): 80 Station: -2  Extremities: Normal range of motion.  Edema: None  Mental Status: Normal mood and affect. Normal behavior. Normal judgment and thought content.   Assessment and Plan:  Pregnancy: [id number] at 7551w3d  1. Short cervix during pregnancy in second trimester FH and FhT normal - GC[facility]Chlamydia probe amp ([place] - Culture, beta strep (group b only)  2. Cervical cerclage suture present, antepartum Cerclage removed.  -  GC[facility]Chlamydia probe amp [facility] - Culture, beta strep (group b only)  Preterm labor symptoms and general obstetric precautions including but not limited to vaginal bleeding, contractions, leaking of fluid and fetal movement were reviewed in detail with the patient. Please refer to After Visit Summary for other counseling recommendations.  No follow-ups on file.  Future Appointments  Date Time Provider Department Center  09/26/2018  9:15 AM Adam PhenixArnold, James G, MD [facility]  10/03/2018  9:15 AM [place] BingPickens, Charlie, MD [facility]  10/10/2018  9:15 AM Levie HeritageStinson, Hyde Sires J, DO [facility]  10/17/2018  9:15 AM Adrian BlackwaterStinson, Rhona RaiderJacob J, DO [facility]    Levie HeritageJacob J Seraphim Affinito, DO

## 2018-09-19 NOTE — Progress Notes (Signed)
Declines 17P .

## 2018-09-23 LAB — CULTURE, BETA STREP (GROUP B ONLY): Strep Gp B Culture: NEGATIVE

## 2018-09-23 LAB — GC/CHLAMYDIA PROBE AMP (~~LOC~~) NOT AT ARMC
Chlamydia: NEGATIVE
Neisseria Gonorrhea: NEGATIVE

## 2018-09-24 ENCOUNTER — Inpatient Hospital Stay (HOSPITAL_COMMUNITY)
Admission: AD | Admit: 2018-09-24 | Discharge: 2018-09-24 | Disposition: A | Discharge status: Home / Self Care | Payer: Medicaid Other | Source: Ambulatory Visit | Attending: Obstetrics & Gynecology | Admitting: Obstetrics & Gynecology

## 2018-09-24 ENCOUNTER — Encounter (HOSPITAL_COMMUNITY): Payer: Self-pay

## 2018-09-24 DIAGNOSIS — Z3A37 37 weeks gestation of pregnancy: Secondary | ICD-10-CM | POA: Diagnosis not present

## 2018-09-24 DIAGNOSIS — O471 False labor at or after 37 completed weeks of gestation: Secondary | ICD-10-CM | POA: Diagnosis present

## 2018-09-24 DIAGNOSIS — O0990 Supervision of high risk pregnancy, unspecified, unspecified trimester: Secondary | ICD-10-CM

## 2018-09-24 DIAGNOSIS — O09899 Supervision of other high risk pregnancies, unspecified trimester: Secondary | ICD-10-CM

## 2018-09-24 DIAGNOSIS — O479 False labor, unspecified: Secondary | ICD-10-CM

## 2018-09-24 LAB — URINALYSIS, ROUTINE W REFLEX MICROSCOPIC
Bilirubin Urine: NEGATIVE
Glucose, UA: 50 mg/dL — AB
Hgb urine dipstick: NEGATIVE
Ketones, ur: NEGATIVE mg/dL
Nitrite: NEGATIVE
Protein, ur: NEGATIVE mg/dL
Specific Gravity, Urine: 1.02 (ref 1.005–1.030)
pH: 6 (ref 5.0–8.0)

## 2018-09-24 NOTE — MAU Note (Signed)
Having ctxs since Weds. Got stronger for last couple hours. Denies bleeding. Some mucous d/c. Cerclage removed 12/30. Hx 27wk delivery. 1.5cm last sve

## 2018-09-24 NOTE — MAU Provider Note (Signed)
Ob teaching service labor check note  Called for labor check. Patient has been having contractions and decided to come in. Dilation 1.5. On monitor for over an hour without any noticeable contraction. No bleeding, gushes of fluid, + fetal movement, no concerning signs. Sent home with expectant management.   Myrene Buddy MD PGY-2 Family Medicine Resident

## 2018-09-26 ENCOUNTER — Ambulatory Visit (INDEPENDENT_AMBULATORY_CARE_PROVIDER_SITE_OTHER): Payer: 59 | Admitting: Obstetrics & Gynecology

## 2018-09-26 VITALS — BP 115/62 | HR 97 | Wt 174.0 lb

## 2018-09-26 DIAGNOSIS — O0993 Supervision of high risk pregnancy, unspecified, third trimester: Secondary | ICD-10-CM

## 2018-09-26 DIAGNOSIS — O0990 Supervision of high risk pregnancy, unspecified, unspecified trimester: Secondary | ICD-10-CM

## 2018-09-26 DIAGNOSIS — O26873 Cervical shortening, third trimester: Secondary | ICD-10-CM

## 2018-09-26 DIAGNOSIS — O26872 Cervical shortening, second trimester: Secondary | ICD-10-CM

## 2018-09-26 NOTE — Patient Instructions (Signed)

## 2018-09-26 NOTE — Progress Notes (Signed)
   PRENATAL VISIT NOTE  Subjective:  Mary Allen is a 23 y.o. [phone] at [redacted]w[redacted]d being seen today for ongoing prenatal care.  She is currently monitored for the following issues for this high-risk pregnancy and has Short cervix during pregnancy in second trimester; Short interval between pregnancies affecting pregnancy, antepartum; H/O pre-term labor; History of gestational hypertension; Supervision of high risk pregnancy, antepartum; and Cervical cerclage suture present on their problem list.  Patient reports occasional contractions.  Contractions: Irregular. Vag. Bleeding: None.  Movement: Present. Denies leaking of fluid.   The following portions of the patient's history were reviewed and updated as appropriate: allergies, current medications, past family history, past medical history, past social history, past surgical history and problem list. Problem list updated.  Objective:   Vitals:   09/26/18 0905  BP: 115/62  Pulse: 97  Weight: 174 lb (78.9 kg)    Fetal Status: Fetal Heart Rate (bpm): 150 Fundal Height: 37 cm Movement: Present     General:  Alert, oriented and cooperative. Patient is in no acute distress.  Skin: Skin is warm and dry. No rash noted.   Cardiovascular: Normal heart rate noted  Respiratory: Normal respiratory effort, no problems with respiration noted  Abdomen: Soft, gravid, appropriate for gestational age.  Pain/Pressure: Present     Pelvic: Cervical exam deferred        Extremities: Normal range of motion.  Edema: None  Mental Status: Normal mood and affect. Normal behavior. Normal judgment and thought content.   Assessment and Plan:  Pregnancy: [id number] at [redacted]w[redacted]d  1. Short cervix during pregnancy in second trimester Cerclage removed 36 weeks  2. Supervision of high risk pregnancy, antepartum Precautions given  Term labor symptoms and general obstetric precautions including but not limited to vaginal bleeding, contractions, leaking of fluid and  fetal movement were reviewed in detail with the patient. Please refer to After Visit Summary for other counseling recommendations.  Return in about 1 week (around 10/03/2018).  Future Appointments  Date Time Provider Department Center  10/03/2018  9:15 AM [place] Bing, MD [facility]  10/10/2018  9:15 AM Levie Heritage, DO [facility]  10/17/2018  9:15 AM Levie Heritage, DO [facility]    Scheryl Darter, MD

## 2018-09-27 ENCOUNTER — Other Ambulatory Visit: Payer: Self-pay

## 2018-09-27 ENCOUNTER — Encounter (HOSPITAL_COMMUNITY): Payer: Self-pay | Admitting: *Deleted

## 2018-09-27 ENCOUNTER — Inpatient Hospital Stay (HOSPITAL_COMMUNITY)
Admission: AD | Admit: 2018-09-27 | Discharge: 2018-09-28 | Disposition: A | Discharge status: Home / Self Care | Payer: Medicaid Other | Attending: Obstetrics & Gynecology | Admitting: Obstetrics & Gynecology

## 2018-09-27 DIAGNOSIS — Z3A37 37 weeks gestation of pregnancy: Secondary | ICD-10-CM | POA: Insufficient documentation

## 2018-09-27 DIAGNOSIS — O471 False labor at or after 37 completed weeks of gestation: Secondary | ICD-10-CM | POA: Insufficient documentation

## 2018-09-27 DIAGNOSIS — O479 False labor, unspecified: Secondary | ICD-10-CM

## 2018-09-27 NOTE — MAU Note (Signed)
Pt presents to MAU c/o ctx every 5-7. No bleeding or LOF. +FM.

## 2018-09-28 DIAGNOSIS — O471 False labor at or after 37 completed weeks of gestation: Secondary | ICD-10-CM | POA: Diagnosis not present

## 2018-09-28 DIAGNOSIS — Z3A37 37 weeks gestation of pregnancy: Secondary | ICD-10-CM | POA: Diagnosis not present

## 2018-09-28 NOTE — Discharge Instructions (Signed)
Vaginal delivery means that you give birth by pushing your baby out of your birth canal (vagina). A team of health care providers will help you before, during, and after vaginal delivery. Birth experiences are unique for every woman and every pregnancy, and birth experiences vary depending on where you choose to give birth. What happens when I arrive at the birth center or hospital? Once you are in labor and have been admitted into the hospital or birth center, your health care provider may:  Review your pregnancy history and any concerns that you have.  Insert an IV into one of your veins. This may be used to give you fluids and medicines.  Check your blood pressure, pulse, temperature, and heart rate (vital signs).  Check whether your bag of water (amniotic sac) has broken (ruptured).  Talk with you about your birth plan and discuss pain control options. Monitoring Your health care provider may monitor your contractions (uterine monitoring) and your baby's heart rate (fetal monitoring). You may need to be monitored:  Often, but not continuously (intermittently).  All the time or for long periods at a time (continuously). Continuous monitoring may be needed if: ? You are taking certain medicines, such as medicine to relieve pain or make your contractions stronger. ? You have pregnancy or labor complications. Monitoring may be done by:  Placing a special stethoscope or a handheld monitoring device on your abdomen to check your baby's heartbeat and to check for contractions.  Placing monitors on your abdomen (external monitors) to record your baby's heartbeat and the frequency and length of contractions.  Placing monitors inside your uterus through your vagina (internal monitors) to record your baby's heartbeat and the frequency, length, and strength of your contractions. Depending on the type of monitor, it may remain in your uterus or on your baby's head until birth.  Telemetry. This is  a type of continuous monitoring that can be done with external or internal monitors. Instead of having to stay in bed, you are able to move around during telemetry. Physical exam Your health care provider may perform frequent physical exams. This may include:  Checking how and where your baby is positioned in your uterus.  Checking your cervix to determine: ? Whether it is thinning out (effacing). ? Whether it is opening up (dilating). What happens during labor and delivery?  Normal labor and delivery is divided into the following three stages: Stage 1  This is the longest stage of labor.  This stage can last for hours or days.  Throughout this stage, you will feel contractions. Contractions generally feel mild, infrequent, and irregular at first. They get stronger, more frequent (about every 2-3 minutes), and more regular as you move through this stage.  This stage ends when your cervix is completely dilated to 4 inches (10 cm) and completely effaced. Stage 2  This stage starts once your cervix is completely effaced and dilated and lasts until the delivery of your baby.  This stage may last from 20 minutes to 2 hours.  This is the stage where you will feel an urge to push your baby out of your vagina.  You may feel stretching and burning pain, especially when the widest part of your baby's head passes through the vaginal opening (crowning).  Once your baby is delivered, the umbilical cord will be clamped and cut. This usually occurs after waiting a period of 1-2 minutes after delivery.  Your baby will be placed on your bare chest (skin-to-skin contact) in   an upright position and covered with a warm blanket. Watch your baby for feeding cues, like rooting or sucking, and help the baby to your breast for his or her first feeding. Stage 3  This stage starts immediately after the birth of your baby and ends after you deliver the placenta.  This stage may take anywhere from 5 to 30  minutes.  After your baby has been delivered, you will feel contractions as your body expels the placenta and your uterus contracts to control bleeding. What can I expect after labor and delivery?  After labor is over, you and your baby will be monitored closely until you are ready to go home to ensure that you are both healthy. Your health care team will teach you how to care for yourself and your baby.  You and your baby will stay in the same room (rooming in) during your hospital stay. This will encourage early bonding and successful breastfeeding.  You may continue to receive fluids and medicines through an IV.  Your uterus will be checked and massaged regularly (fundal massage).  You will have some soreness and pain in your abdomen, vagina, and the area of skin between your vaginal opening and your anus (perineum).  If an incision was made near your vagina (episiotomy) or if you had some vaginal tearing during delivery, cold compresses may be placed on your episiotomy or your tear. This helps to reduce pain and swelling.  You may be given a squirt bottle to use instead of wiping when you go to the bathroom. To use the squirt bottle, follow these steps: ? Before you urinate, fill the squirt bottle with warm water. Do not use hot water. ? After you urinate, while you are sitting on the toilet, use the squirt bottle to rinse the area around your urethra and vaginal opening. This rinses away any urine and blood. ? Fill the squirt bottle with clean water every time you use the bathroom.  It is normal to have vaginal bleeding after delivery. Wear a sanitary pad for vaginal bleeding and discharge. Summary  Vaginal delivery means that you will give birth by pushing your baby out of your birth canal (vagina).  Your health care provider may monitor your contractions (uterine monitoring) and your baby's heart rate (fetal monitoring).  Your health care provider may perform a physical  exam.  Normal labor and delivery is divided into three stages.  After labor is over, you and your baby will be monitored closely until you are ready to go home. This information is not intended to replace advice given to you by your health care provider. Make sure you discuss any questions you have with your health care provider. Document Released: 06/16/2008 Document Revised: 10/12/2017 Document Reviewed: 10/12/2017 Elsevier Interactive Patient Education  2019 Elsevier Inc.  

## 2018-09-28 NOTE — MAU Note (Signed)
I have communicated with Wynelle Bourgeois, CNM/Heather Mathews Robinsons, CNM and reviewed vital signs:  Vitals:   09/27/18 2224  BP: 119/71  Pulse: (!) 107  Resp: 18  Temp: 98.3 F (36.8 C)    Vaginal exam:  Dilation: 2 Effacement (%): 80 Cervical Position: Middle Station: -2 Presentation: Vertex Exam by:: Camelia Eng RN,   Also reviewed contraction pattern and that non-stress test is reactive.  It has been documented that patient is contracting every 5-6 minutes with no cervical change over 3 hours not indicating active labor.  Patient denies any other complaints. Provider reviewed fetal heart tracing. Based on this report provider has given order for discharge.  A discharge order and diagnosis entered by a provider.   Labor discharge instructions reviewed with patient.

## 2018-09-29 ENCOUNTER — Inpatient Hospital Stay (HOSPITAL_COMMUNITY)
Admission: AD | Admit: 2018-09-29 | Discharge: 2018-09-29 | Disposition: A | Discharge status: Home / Self Care | Payer: Medicaid Other | Source: Ambulatory Visit | Attending: Obstetrics and Gynecology | Admitting: Obstetrics and Gynecology

## 2018-09-29 ENCOUNTER — Encounter (HOSPITAL_COMMUNITY): Payer: Self-pay | Admitting: *Deleted

## 2018-09-29 DIAGNOSIS — O471 False labor at or after 37 completed weeks of gestation: Secondary | ICD-10-CM | POA: Diagnosis present

## 2018-09-29 DIAGNOSIS — O479 False labor, unspecified: Secondary | ICD-10-CM

## 2018-09-29 DIAGNOSIS — Z3A38 38 weeks gestation of pregnancy: Secondary | ICD-10-CM | POA: Diagnosis not present

## 2018-09-29 NOTE — Discharge Instructions (Signed)
Braxton Hicks Contractions Contractions of the uterus can occur throughout pregnancy, but they are not always a sign that you are in labor. You may have practice contractions called Braxton Hicks contractions. These false labor contractions are sometimes confused with true labor. What are Braxton Hicks contractions? Braxton Hicks contractions are tightening movements that occur in the muscles of the uterus before labor. Unlike true labor contractions, these contractions do not result in opening (dilation) and thinning of the cervix. Toward the end of pregnancy (32-34 weeks), Braxton Hicks contractions can happen more often and may become stronger. These contractions are sometimes difficult to tell apart from true labor because they can be very uncomfortable. You should not feel embarrassed if you go to the hospital with false labor. Sometimes, the only way to tell if you are in true labor is for your health care provider to look for changes in the cervix. The health care provider will do a physical exam and may monitor your contractions. If you are not in true labor, the exam should show that your cervix is not dilating and your water has not broken. If there are no other health problems associated with your pregnancy, it is completely safe for you to be sent home with false labor. You may continue to have Braxton Hicks contractions until you go into true labor. How to tell the difference between true labor and false labor True labor  Contractions last 30-70 seconds.  Contractions become very regular.  Discomfort is usually felt in the top of the uterus, and it spreads to the lower abdomen and low back.  Contractions do not go away with walking.  Contractions usually become more intense and increase in frequency.  The cervix dilates and gets thinner. False labor  Contractions are usually shorter and not as strong as true labor contractions.  Contractions are usually irregular.  Contractions  are often felt in the front of the lower abdomen and in the groin.  Contractions may go away when you walk around or change positions while lying down.  Contractions get weaker and are shorter-lasting as time goes on.  The cervix usually does not dilate or become thin. Follow these instructions at home:   Take over-the-counter and prescription medicines only as told by your health care provider.  Keep up with your usual exercises and follow other instructions from your health care provider.  Eat and drink lightly if you think you are going into labor.  If Braxton Hicks contractions are making you uncomfortable: ? Change your position from lying down or resting to walking, or change from walking to resting. ? Sit and rest in a tub of warm water. ? Drink enough fluid to keep your urine pale yellow. Dehydration may cause these contractions. ? Do slow and deep breathing several times an hour.  Keep all follow-up prenatal visits as told by your health care provider. This is important. Contact a health care provider if:  You have a fever.  You have continuous pain in your abdomen. Get help right away if:  Your contractions become stronger, more regular, and closer together.  You have fluid leaking or gushing from your vagina.  You pass blood-tinged mucus (bloody show).  You have bleeding from your vagina.  You have low back pain that you never had before.  You feel your baby's head pushing down and causing pelvic pressure.  Your baby is not moving inside you as much as it used to. Summary  Contractions that occur before labor are   called Braxton Hicks contractions, false labor, or practice contractions.  Braxton Hicks contractions are usually shorter, weaker, farther apart, and less regular than true labor contractions. True labor contractions usually become progressively stronger and regular, and they become more frequent.  Manage discomfort from Braxton Hicks contractions  by changing position, resting in a warm bath, drinking plenty of water, or practicing deep breathing. This information is not intended to replace advice given to you by your health care provider. Make sure you discuss any questions you have with your health care provider. Document Released: 01/21/2017 Document Revised: 06/22/2017 Document Reviewed: 01/21/2017 Elsevier Interactive Patient Education  2019 [employer] Inc.  

## 2018-09-29 NOTE — MAU Note (Signed)
Pt reports contractions every 5-7 mins and lots of pelvic pressure. Pt denies LOF or vaginal bleeding. Pt reports a lot of mucousy discharge. Reports good fetal movement. Cervix was 2cm on last exam.

## 2018-09-29 NOTE — MAU Note (Cosign Needed)
NST: FHR baseline 145 bpm, Variability: moderate, Accelerations:present, Decelerations:  Absent= Cat 1/Reactive.  irreg ctx. Dc home w/labor precautions

## 2018-10-03 ENCOUNTER — Ambulatory Visit (INDEPENDENT_AMBULATORY_CARE_PROVIDER_SITE_OTHER): Payer: 59 | Admitting: Obstetrics and Gynecology

## 2018-10-03 VITALS — BP 111/75 | HR 94 | Wt 177.4 lb

## 2018-10-03 DIAGNOSIS — O26873 Cervical shortening, third trimester: Secondary | ICD-10-CM

## 2018-10-03 DIAGNOSIS — O26872 Cervical shortening, second trimester: Secondary | ICD-10-CM

## 2018-10-03 DIAGNOSIS — O09293 Supervision of pregnancy with other poor reproductive or obstetric history, third trimester: Secondary | ICD-10-CM

## 2018-10-03 DIAGNOSIS — O0993 Supervision of high risk pregnancy, unspecified, third trimester: Secondary | ICD-10-CM

## 2018-10-03 DIAGNOSIS — O368130 Decreased fetal movements, third trimester, not applicable or unspecified: Secondary | ICD-10-CM | POA: Diagnosis not present

## 2018-10-03 DIAGNOSIS — Z8759 Personal history of other complications of pregnancy, childbirth and the puerperium: Secondary | ICD-10-CM

## 2018-10-03 DIAGNOSIS — O0990 Supervision of high risk pregnancy, unspecified, unspecified trimester: Secondary | ICD-10-CM

## 2018-10-03 NOTE — Progress Notes (Signed)
States usually feels baby move 6-10 times per day but C/o baby moving 3-4 times a day since about 09/27/18- she went to MAU on 1/7 and 1/9 because she thought she was in labor.

## 2018-10-03 NOTE — Progress Notes (Signed)
Prenatal Visit Note Date: 10/03/2018 Clinic: [facility]  Subjective:  Mary Allen is a 23 y.o. [phone] at [redacted]w[redacted]d being seen today for ongoing prenatal care.  She is currently monitored for the following issues for this high-risk pregnancy and has Short cervix during pregnancy in second trimester; Short interval between pregnancies affecting pregnancy, antepartum; H/O pre-term labor; History of gestational hypertension; Supervision of high risk pregnancy, antepartum; and Cervical cerclage suture present on their problem list.  Patient reports decreased FM.   Contractions: Irregular. Vag. Bleeding: None.  Movement: (!) Decreased. Denies leaking of fluid.   The following portions of the patient's history were reviewed and updated as appropriate: allergies, current medications, past family history, past medical history, past social history, past surgical history and problem list. Problem list updated.  Objective:   Vitals:   10/03/18 0930  BP: 111/75  Pulse: 94  Weight: 177 lb 6.4 oz (80.5 kg)    Fetal Status: Fetal Heart Rate (bpm): 140 Fundal Height: 38 cm Movement: (!) Decreased  Presentation: Vertex  General:  Alert, oriented and cooperative. Patient is in no acute distress.  Skin: Skin is warm and dry. No rash noted.   Cardiovascular: Normal heart rate noted  Respiratory: Normal respiratory effort, no problems with respiration noted  Abdomen: Soft, gravid, appropriate for gestational age. Pain/Pressure: Present     Pelvic:  Cervical exam deferred Dilation: 2.5 Effacement (%): 50 Station: -2  Extremities: Normal range of motion.  Edema: Trace  Mental Status: Normal mood and affect. Normal behavior. Normal judgment and thought content.   Urinalysis:      Assessment and Plan:  Pregnancy: [id number] at [redacted]w[redacted]d  1. Short cervix during pregnancy in second trimester  2. History of gestational hypertension  3. Supervision of high risk pregnancy,  antepartum D/w pt re: BC NV  4. Decreased fetal movement affecting management of pregnancy in third trimester, single or unspecified fetus FKC precautions given. Reactive NST today 135 baseline, +accels, no decel, mod variability, q5-65m UCs x 38m  Term labor symptoms and general obstetric precautions including but not limited to vaginal bleeding, contractions, leaking of fluid and fetal movement were reviewed in detail with the patient. Please refer to After Visit Summary for other counseling recommendations.  Return in about 1 week (around 10/10/2018) for hrob.   Louise Bing, MD

## 2018-10-04 ENCOUNTER — Telehealth: Payer: Self-pay

## 2018-10-04 NOTE — Telephone Encounter (Signed)
LM for pt that I am calling in regards to a MyChart that was sent if she could please call the office.

## 2018-10-05 ENCOUNTER — Encounter (HOSPITAL_COMMUNITY): Payer: Self-pay

## 2018-10-05 ENCOUNTER — Other Ambulatory Visit: Payer: Self-pay

## 2018-10-05 ENCOUNTER — Inpatient Hospital Stay (HOSPITAL_COMMUNITY): Payer: 59 | Admitting: Anesthesiology

## 2018-10-05 ENCOUNTER — Inpatient Hospital Stay (HOSPITAL_COMMUNITY)
Admission: AD | Admit: 2018-10-05 | Discharge: 2018-10-09 | DRG: 788 | Disposition: A | Discharge status: Home / Self Care | Payer: 59 | Attending: Obstetrics & Gynecology | Admitting: Obstetrics & Gynecology

## 2018-10-05 DIAGNOSIS — O324XX0 Maternal care for high head at term, not applicable or unspecified: Principal | ICD-10-CM | POA: Diagnosis present

## 2018-10-05 DIAGNOSIS — Z3A38 38 weeks gestation of pregnancy: Secondary | ICD-10-CM | POA: Diagnosis not present

## 2018-10-05 DIAGNOSIS — Z3483 Encounter for supervision of other normal pregnancy, third trimester: Secondary | ICD-10-CM | POA: Diagnosis present

## 2018-10-05 DIAGNOSIS — Z87891 Personal history of nicotine dependence: Secondary | ICD-10-CM

## 2018-10-05 DIAGNOSIS — Z8751 Personal history of pre-term labor: Secondary | ICD-10-CM

## 2018-10-05 DIAGNOSIS — Z8759 Personal history of other complications of pregnancy, childbirth and the puerperium: Secondary | ICD-10-CM

## 2018-10-05 DIAGNOSIS — O26872 Cervical shortening, second trimester: Secondary | ICD-10-CM | POA: Diagnosis present

## 2018-10-05 DIAGNOSIS — O09899 Supervision of other high risk pregnancies, unspecified trimester: Secondary | ICD-10-CM

## 2018-10-05 LAB — CBC
HCT: 32.6 % — ABNORMAL LOW (ref 36.0–46.0)
HEMOGLOBIN: 10.8 g/dL — AB (ref 12.0–15.0)
MCH: 29.7 pg (ref 26.0–34.0)
MCHC: 33.1 g/dL (ref 30.0–36.0)
MCV: 89.6 fL (ref 80.0–100.0)
Platelets: 185 10*3/uL (ref 150–400)
RBC: 3.64 MIL/uL — ABNORMAL LOW (ref 3.87–5.11)
RDW: 13 % (ref 11.5–15.5)
WBC: 9.5 10*3/uL (ref 4.0–10.5)
nRBC: 0 % (ref 0.0–0.2)

## 2018-10-05 LAB — TYPE AND SCREEN
ABO/RH(D): O POS
Antibody Screen: NEGATIVE

## 2018-10-05 MED ORDER — PHENYLEPHRINE 40 MCG/ML (10ML) SYRINGE FOR IV PUSH (FOR BLOOD PRESSURE SUPPORT)
80.0000 ug | PREFILLED_SYRINGE | INTRAVENOUS | Status: AC | PRN
  Administered 2018-10-06 (×3): 80 ug via INTRAVENOUS

## 2018-10-05 MED ORDER — OXYTOCIN 40 UNITS IN NORMAL SALINE INFUSION - SIMPLE MED
2.5000 [IU]/h | INTRAVENOUS | Status: DC
  Filled 2018-10-05: qty 1000

## 2018-10-05 MED ORDER — OXYTOCIN BOLUS FROM INFUSION: 500.0000 mL | Freq: Once | INTRAVENOUS | Status: DC

## 2018-10-05 MED ORDER — LACTATED RINGERS IV SOLN
500.0000 mL | Freq: Once | INTRAVENOUS | Status: AC
  Administered 2018-10-05: 500 mL via INTRAVENOUS

## 2018-10-05 MED ORDER — LACTATED RINGERS IV SOLN: 500.0000 mL | Freq: Once | INTRAVENOUS | Status: DC

## 2018-10-05 MED ORDER — PHENYLEPHRINE 40 MCG/ML (10ML) SYRINGE FOR IV PUSH (FOR BLOOD PRESSURE SUPPORT)
80.0000 ug | PREFILLED_SYRINGE | INTRAVENOUS | Status: DC | PRN
  Filled 2018-10-05: qty 10

## 2018-10-05 MED ORDER — EPHEDRINE 5 MG/ML INJ: 10.0000 mg | INTRAVENOUS | Status: DC | PRN

## 2018-10-05 MED ORDER — FENTANYL 2.5 MCG/ML BUPIVACAINE 1/10 % EPIDURAL INFUSION (WH - ANES)
14.0000 mL/h | INTRAMUSCULAR | Status: DC | PRN
  Administered 2018-10-05 – 2018-10-06 (×2): 14 mL/h via EPIDURAL
  Filled 2018-10-05 (×2): qty 100

## 2018-10-05 MED ORDER — DIPHENHYDRAMINE HCL 50 MG/ML IJ SOLN: 12.5000 mg | INTRAMUSCULAR | Status: DC | PRN

## 2018-10-05 MED ORDER — LIDOCAINE HCL (PF) 1 % IJ SOLN
INTRAMUSCULAR | Status: DC | PRN
  Administered 2018-10-05: 5 mL via EPIDURAL

## 2018-10-05 MED ORDER — LACTATED RINGERS IV SOLN
500.0000 mL | INTRAVENOUS | Status: DC | PRN
  Administered 2018-10-06: 1000 mL via INTRAVENOUS

## 2018-10-05 MED ORDER — FENTANYL CITRATE (PF) 100 MCG/2ML IJ SOLN
100.0000 ug | INTRAMUSCULAR | Status: DC | PRN
  Administered 2018-10-05: 100 ug via INTRAVENOUS
  Filled 2018-10-05: qty 2

## 2018-10-05 MED ORDER — PHENYLEPHRINE 40 MCG/ML (10ML) SYRINGE FOR IV PUSH (FOR BLOOD PRESSURE SUPPORT): 80.0000 ug | PREFILLED_SYRINGE | INTRAVENOUS | Status: DC | PRN

## 2018-10-05 MED ORDER — LIDOCAINE HCL (PF) 1 % IJ SOLN: 30.0000 mL | INTRAMUSCULAR | Status: DC | PRN

## 2018-10-05 MED ORDER — FLEET ENEMA 7-19 GM/118ML RE ENEM: 1.0000 | ENEMA | RECTAL | Status: DC | PRN

## 2018-10-05 MED ORDER — LACTATED RINGERS IV SOLN
INTRAVENOUS | Status: DC
  Administered 2018-10-05 – 2018-10-06 (×3): via INTRAVENOUS

## 2018-10-05 MED ORDER — SOD CITRATE-CITRIC ACID 500-334 MG/5ML PO SOLN
30.0000 mL | ORAL | Status: DC | PRN
  Filled 2018-10-05: qty 15

## 2018-10-05 MED ORDER — ONDANSETRON HCL 4 MG/2ML IJ SOLN: 4.0000 mg | Freq: Four times a day (QID) | INTRAMUSCULAR | Status: DC | PRN

## 2018-10-05 NOTE — Anesthesia Procedure Notes (Addendum)
Epidural Patient location during procedure: OB Start time: 10/05/2018 11:22 PM End time: 10/05/2018 11:37 PM  Staffing Anesthesiologist: Trevor Iha, MD Performed: anesthesiologist   Preanesthetic Checklist Completed: patient identified, site marked, surgical consent, pre-op evaluation, timeout performed, IV checked, risks and benefits discussed and monitors and equipment checked  Epidural Patient position: sitting Prep: site prepped and draped and DuraPrep Patient monitoring: continuous pulse ox and blood pressure Approach: midline Location: L3-L4 Injection technique: LOR air  Needle:  Needle type: Tuohy  Needle gauge: 17 G Needle length: 9 cm and 9 Needle insertion depth: 8 cm Catheter type: closed end flexible Catheter size: 19 Gauge Catheter at skin depth: 13 cm Test dose: negative  Assessment Events: blood not aspirated, injection not painful, no injection resistance, negative IV test and no paresthesia  Additional Notes Patient identified. Risks/Benefits/Options discussed with patient including but not limited to bleeding, infection, nerve damage, paralysis, failed block, incomplete pain control, headache, blood pressure changes, nausea, vomiting, reactions to medication both or allergic, itching and postpartum back pain. Confirmed with bedside nurse the patient's most recent platelet count. Confirmed with patient that they are not currently taking any anticoagulation, have any bleeding history or any family history of bleeding disorders. Patient expressed understanding and wished to proceed. All questions were answered. Sterile technique was used throughout the entire procedure. Please see nursing notes for vital signs. Test dose was given through epidural needle and negative prior to continuing to dose epidural or start infusion. Warning signs of high block given to the patient including shortness of breath, tingling/numbness in hands, complete motor block, or any  concerning symptoms with instructions to call for help. Patient was given instructions on fall risk and not to get out of bed. All questions and concerns addressed with instructions to call with any issues.1  Attempt (S) . Patient tolerated procedure well.

## 2018-10-05 NOTE — Anesthesia Procedure Notes (Deleted)
Epidural

## 2018-10-05 NOTE — MAU Note (Signed)
Pt here contractions that started about 1 hour ago-every mins. Denies LOF or vaginal bleeding. Reports good fetal movement. Cervix 2.5cm on last exam.

## 2018-10-05 NOTE — Anesthesia Preprocedure Evaluation (Addendum)
Anesthesia Evaluation  Patient identified by MRN, date of birth, ID band Patient awake    Reviewed: Allergy & Precautions, NPO status , Patient's Chart, lab work & pertinent test resultsPreop documentation limited or incomplete due to emergent nature of procedure.  Airway Mallampati: II  TM Distance: >3 FB Neck ROM: Full    Dental no notable dental hx. (+) Teeth Intact   Pulmonary neg pulmonary ROS, former smoker,    Pulmonary exam normal breath sounds clear to auscultation       Cardiovascular hypertension, Normal cardiovascular exam Rhythm:Regular Rate:Normal     Neuro/Psych negative neurological ROS  negative psych ROS   GI/Hepatic   Endo/Other    Renal/GU      Musculoskeletal   Abdominal (+) + obese,   Peds  Hematology   Anesthesia Other Findings   Reproductive/Obstetrics (+) Pregnancy                           Lab Results  Component Value Date   WBC 9.5 10/05/2018   HGB 10.8 (L) 10/05/2018   HCT 32.6 (L) 10/05/2018   MCV 89.6 10/05/2018   PLT 185 10/05/2018    Anesthesia Physical Anesthesia Plan  ASA: III  Anesthesia Plan: Epidural   Post-op Pain Management:    Induction:   PONV Risk Score and Plan: 3 and Ondansetron, Dexamethasone and Scopolamine patch - Pre-op  Airway Management Planned:   Additional Equipment:   Intra-op Plan:   Post-operative Plan:   Informed Consent: I have reviewed the patients History and Physical, chart, labs and discussed the procedure including the risks, benefits and alternatives for the proposed anesthesia with the patient or authorized representative who has indicated his/her understanding and acceptance.     Dental advisory given  Plan Discussed with:   Anesthesia Plan Comments: (Stat for fetal distress )      Anesthesia Quick Evaluation

## 2018-10-05 NOTE — H&P (Addendum)
OBSTETRIC ADMISSION HISTORY AND PHYSICAL  Mary Allen is a 23 y.o. female [phone] with IUP at 6265w5d by ultrasound presenting for spontaneous onset of labor. She reports +FMs, No LOF or VB.   She plans on bottle feeding. She requests Nexplanon for birth control.  Dating: By ultrasound --->  Estimated Date of Delivery: 10/14/18  Sono:   @[redacted]w[redacted]d , CWD, normal anatomy, vertex presentation, 46% EFW   Prenatal History/Complications: She [facility]  Pregnancy was complicated by: - Short cervix in second trimester that was managed with cervical cerclage (removed 12/30) and Makena.  - History of preterm labor - History of gestational hypertension in prior pregnancy.  Past Medical History: Past Medical History:  Diagnosis Date  . Pregnancy induced hypertension     Past Surgical History: Past Surgical History:  Procedure Laterality Date  . CERVICAL CERCLAGE    . DILATION AND CURETTAGE OF UTERUS  2016    Obstetrical History: OB History    Gravida  3   Para  1   Term      Preterm  1   AB  1   Living  1     SAB  1   TAB      Ectopic      Multiple      Live Births  1        Obstetric Comments  D&C        Social History: Social History   Socioeconomic History  . Marital status: Single    Spouse name: Not on file  . Number of children: Not on file  . Years of education: Not on file  . Highest education level: Not on file  Occupational History  . Not on file  Social Needs  . Financial resource strain: Not on file  . Food insecurity:    Worry: Not on file    Inability: Not on file  . Transportation needs:    Medical: Not on file    Non-medical: Not on file  Tobacco Use  . Smoking status: Former Smoker    Last attempt to quit: 02/24/2018    Years since quitting: 0.6  . Smokeless tobacco: Never Used  Substance and Sexual Activity  . Alcohol use: Not Currently  . Drug use: Never  . Sexual activity: Not Currently    Birth  control/protection: None  Lifestyle  . Physical activity:    Days per week: Not on file    Minutes per session: Not on file  . Stress: Not on file  Relationships  . Social connections:    Talks on phone: Not on file    Gets together: Not on file    Attends religious service: Not on file    Active member of club or organization: Not on file    Attends meetings of clubs or organizations: Not on file    Relationship status: Not on file  Other Topics Concern  . Not on file  Social History Narrative  . Not on file    Family History: Family History  Problem Relation Age of Onset  . Diabetes Maternal Grandfather   . Hypertension Maternal Grandfather     Allergies: No Known Allergies  Medications Prior to Admission  Medication Sig Dispense Refill Last Dose  . aspirin 81 MG chewable tablet Chew 1 tablet (81 mg total) by mouth daily. 60 tablet 1 Taking  . Prenatal Multivit-Min-Fe-FA (PRENATAL VITAMINS PO) Take 1 tablet by mouth daily.   Taking  Review of Systems   All systems reviewed and negative except as stated in HPI  Blood pressure 117/73, pulse 85, temperature 97.7 F (36.5 C), resp. rate 20, height 4\' 11"  (1.499 m), weight 81.2 kg, SpO2 99 %. General appearance: alert, cooperative and no distress Lungs: clear to auscultation bilaterally Heart: regular rate and rhythm Abdomen: soft, non-tender; bowel sounds normal Extremities: Homans sign is negative, no sign of DVT Presentation: cephalic Fetal monitoringBaseline: 125 bpm, Variability: Good {> 6 bpm), Accelerations: 15x15 and Decelerations: Absent Uterine activity Frequency: Every 3-5 minutes Dilation: 4 Effacement (%): 90 Station: -2 Exam by:: Zenia Resides, RN    Prenatal labs: ABO, Rh: --/--/O POS (01/15 2238) Antibody: NEG (01/15 2238) Rubella: 3.08 (11/11 1030) RPR: Non Reactive (11/11 1030)  HBsAg: Negative (11/11 1030)  HIV: Non Reactive (11/11 1030)  GBS: Negative (12/30 0000)  1 hr Glucola  Normal Genetic screening  Declined Anatomy US Normal  Prenatal Transfer Tool  Maternal Diabetes: No Genetic Screening: Declined Maternal Ultrasounds/Referrals: Normal Fetal Ultrasounds or other Referrals:  None Maternal Substance Abuse:  No Significant Maternal Medications:  None Significant Maternal Lab Results: None  Results for orders placed or performed during the hospital encounter of 10/05/18 (from the past 24 hour(s))  CBC   Collection Time: 10/05/18 10:38 PM  Result Value Ref Range   WBC 9.5 4.0 - 10.5 K/uL   RBC 3.64 (L) 3.87 - 5.11 MIL/uL   Hemoglobin 10.8 (L) 12.0 - 15.0 g/dL   HCT 16.1 (L) 09.6 - 04.5 %   MCV 89.6 80.0 - 100.0 fL   MCH 29.7 26.0 - 34.0 pg   MCHC 33.1 30.0 - 36.0 g/dL   RDW 40.9 81.1 - 91.4 %   Platelets 185 150 - 400 K/uL   nRBC 0.0 0.0 - 0.2 %  Type and screen [facility]   Collection Time: 10/05/18 10:38 PM  Result Value Ref Range   ABO/RH(D) O POS    Antibody Screen NEG    Sample Expiration      10/08/2018 Performed at [facility], [address], [place], [place] [address]     Patient Active Problem List   Diagnosis Date Noted  . Indication for care in labor or delivery 10/05/2018  . Cervical cerclage suture present 08/16/2018  . Supervision of high risk pregnancy, antepartum 08/01/2018  . Short interval between pregnancies affecting pregnancy, antepartum 06/09/2018  . H/O pre-term labor 06/09/2018  . History of gestational hypertension 06/09/2018  . Short cervix during pregnancy in second trimester 06/04/2018    Assessment/Plan:  Mary Allen is a 23 y.o. [id number] at [redacted]w[redacted]d here for SOL.   #Labor: Progressing without augmentation. Membrane still intact. Will recheck in 2-4 hours and consider amniotomy or pitocin if not progressing.  #Pain: Epidural in place #FWB: Category 1 #ID:  GBS negative #MOF: Bottle #MOC: Nexplanon #Circ:  N/a  History of gHTN: BPs have been WNL throughout current  pregnancy. Patient on ASA.   Philis Kendall, Student-PA  10/05/2018, 11:59 PM  Attestation: I have seen this patient and agree with the physician assistant student's documentation. I have examined them separately, and we have discussed the plan of care.  Cristal Deer. Earlene Plater, DO OB/GYN Fellow

## 2018-10-06 ENCOUNTER — Encounter (HOSPITAL_COMMUNITY): Payer: Self-pay | Admitting: *Deleted

## 2018-10-06 ENCOUNTER — Encounter (HOSPITAL_COMMUNITY)
Admission: AD | Disposition: A | Discharge status: Home / Self Care | Payer: Self-pay | Source: Home / Self Care | Attending: Obstetrics & Gynecology

## 2018-10-06 DIAGNOSIS — O324XX0 Maternal care for high head at term, not applicable or unspecified: Secondary | ICD-10-CM

## 2018-10-06 DIAGNOSIS — Z3A38 38 weeks gestation of pregnancy: Secondary | ICD-10-CM

## 2018-10-06 SURGERY — Surgical Case
Anesthesia: Epidural | Wound class: Clean Contaminated

## 2018-10-06 MED ORDER — MEPERIDINE HCL 25 MG/ML IJ SOLN: 6.2500 mg | INTRAMUSCULAR | Status: DC | PRN

## 2018-10-06 MED ORDER — OXYTOCIN 10 UNIT/ML IJ SOLN
INTRAMUSCULAR | Status: AC
  Filled 2018-10-06: qty 4

## 2018-10-06 MED ORDER — ONDANSETRON HCL 4 MG/2ML IJ SOLN
INTRAMUSCULAR | Status: AC
  Filled 2018-10-06: qty 2

## 2018-10-06 MED ORDER — PROMETHAZINE HCL 25 MG/ML IJ SOLN: 6.2500 mg | INTRAMUSCULAR | Status: DC | PRN

## 2018-10-06 MED ORDER — LIDOCAINE-EPINEPHRINE (PF) 2 %-1:200000 IJ SOLN
INTRAMUSCULAR | Status: AC
  Filled 2018-10-06: qty 20

## 2018-10-06 MED ORDER — SODIUM BICARBONATE 8.4 % IV SOLN
INTRAVENOUS | Status: AC
  Filled 2018-10-06: qty 50

## 2018-10-06 MED ORDER — MENTHOL 3 MG MT LOZG: 1.0000 | LOZENGE | OROMUCOSAL | Status: DC | PRN

## 2018-10-06 MED ORDER — ONDANSETRON HCL 4 MG/2ML IJ SOLN
INTRAMUSCULAR | Status: DC | PRN
  Administered 2018-10-06: 4 mg via INTRAVENOUS

## 2018-10-06 MED ORDER — MORPHINE SULFATE (PF) 0.5 MG/ML IJ SOLN
INTRAMUSCULAR | Status: DC | PRN
  Administered 2018-10-06: 3 mg via EPIDURAL

## 2018-10-06 MED ORDER — LACTATED RINGERS IV SOLN
INTRAVENOUS | Status: DC
  Administered 2018-10-06: 22:00:00 via INTRAVENOUS

## 2018-10-06 MED ORDER — IBUPROFEN 800 MG PO TABS
800.0000 mg | ORAL_TABLET | Freq: Three times a day (TID) | ORAL | Status: AC
  Administered 2018-10-06 – 2018-10-09 (×9): 800 mg via ORAL
  Filled 2018-10-06 (×9): qty 1

## 2018-10-06 MED ORDER — OXYCODONE HCL 5 MG PO TABS
5.0000 mg | ORAL_TABLET | ORAL | Status: DC | PRN
  Administered 2018-10-07: 5 mg via ORAL
  Administered 2018-10-07 – 2018-10-09 (×9): 10 mg via ORAL
  Filled 2018-10-06 (×6): qty 2
  Filled 2018-10-06: qty 1
  Filled 2018-10-06 (×3): qty 2

## 2018-10-06 MED ORDER — SIMETHICONE 80 MG PO CHEW
80.0000 mg | CHEWABLE_TABLET | ORAL | Status: DC
  Administered 2018-10-06 – 2018-10-08 (×3): 80 mg via ORAL
  Filled 2018-10-06 (×3): qty 1

## 2018-10-06 MED ORDER — BUPIVACAINE HCL (PF) 0.5 % IJ SOLN
INTRAMUSCULAR | Status: DC | PRN
  Administered 2018-10-06: 30 mL

## 2018-10-06 MED ORDER — TETANUS-DIPHTH-ACELL PERTUSSIS 5-2.5-18.5 LF-MCG/0.5 IM SUSP: 0.5000 mL | Freq: Once | INTRAMUSCULAR | Status: DC

## 2018-10-06 MED ORDER — OXYTOCIN 40 UNITS IN NORMAL SALINE INFUSION - SIMPLE MED: 2.5000 [IU]/h | INTRAVENOUS | Status: AC

## 2018-10-06 MED ORDER — DIBUCAINE 1 % RE OINT: 1.0000 "application " | TOPICAL_OINTMENT | RECTAL | Status: DC | PRN

## 2018-10-06 MED ORDER — SCOPOLAMINE 1 MG/3DAYS TD PT72
MEDICATED_PATCH | TRANSDERMAL | Status: AC
  Filled 2018-10-06: qty 1

## 2018-10-06 MED ORDER — DEXAMETHASONE SODIUM PHOSPHATE 10 MG/ML IJ SOLN
INTRAMUSCULAR | Status: DC | PRN
  Administered 2018-10-06: 10 mg via INTRAVENOUS

## 2018-10-06 MED ORDER — CEFAZOLIN SODIUM-DEXTROSE 2-4 GM/100ML-% IV SOLN
INTRAVENOUS | Status: AC
  Filled 2018-10-06: qty 100

## 2018-10-06 MED ORDER — SODIUM CHLORIDE 0.9 % IV SOLN
500.0000 mg | Freq: Once | INTRAVENOUS | Status: AC
  Administered 2018-10-06: 500 mg via INTRAVENOUS
  Filled 2018-10-06: qty 500

## 2018-10-06 MED ORDER — MORPHINE SULFATE (PF) 0.5 MG/ML IJ SOLN
INTRAMUSCULAR | Status: AC
  Filled 2018-10-06: qty 10

## 2018-10-06 MED ORDER — KETOROLAC TROMETHAMINE 30 MG/ML IJ SOLN: 30.0000 mg | Freq: Once | INTRAMUSCULAR | Status: DC | PRN

## 2018-10-06 MED ORDER — SODIUM BICARBONATE 8.4 % IV SOLN
INTRAVENOUS | Status: DC | PRN
  Administered 2018-10-06: 10 mL via EPIDURAL

## 2018-10-06 MED ORDER — ENOXAPARIN SODIUM 40 MG/0.4ML ~~LOC~~ SOLN
40.0000 mg | SUBCUTANEOUS | Status: DC
  Administered 2018-10-07 – 2018-10-09 (×3): 40 mg via SUBCUTANEOUS
  Filled 2018-10-06 (×3): qty 0.4

## 2018-10-06 MED ORDER — SODIUM CHLORIDE 0.9 % IR SOLN
Status: DC | PRN
  Administered 2018-10-06: 1

## 2018-10-06 MED ORDER — ACETAMINOPHEN 325 MG PO TABS
650.0000 mg | ORAL_TABLET | ORAL | Status: DC | PRN
  Administered 2018-10-07: 650 mg via ORAL
  Filled 2018-10-06: qty 2

## 2018-10-06 MED ORDER — SCOPOLAMINE 1 MG/3DAYS TD PT72
MEDICATED_PATCH | TRANSDERMAL | Status: DC | PRN
  Administered 2018-10-06: 1 via TRANSDERMAL

## 2018-10-06 MED ORDER — DEXAMETHASONE SODIUM PHOSPHATE 10 MG/ML IJ SOLN
INTRAMUSCULAR | Status: AC
  Filled 2018-10-06: qty 1

## 2018-10-06 MED ORDER — BUPIVACAINE HCL (PF) 0.5 % IJ SOLN
INTRAMUSCULAR | Status: AC
  Filled 2018-10-06: qty 30

## 2018-10-06 MED ORDER — COCONUT OIL OIL: 1.0000 "application " | TOPICAL_OIL | Status: DC | PRN

## 2018-10-06 MED ORDER — DIPHENHYDRAMINE HCL 25 MG PO CAPS
25.0000 mg | ORAL_CAPSULE | Freq: Four times a day (QID) | ORAL | Status: DC | PRN
  Administered 2018-10-07: 25 mg via ORAL
  Filled 2018-10-06: qty 1

## 2018-10-06 MED ORDER — OXYTOCIN 10 UNIT/ML IJ SOLN
INTRAVENOUS | Status: DC | PRN
  Administered 2018-10-06: 40 [IU] via INTRAVENOUS

## 2018-10-06 MED ORDER — HYDROMORPHONE HCL 1 MG/ML IJ SOLN: 0.2500 mg | INTRAMUSCULAR | Status: DC | PRN

## 2018-10-06 MED ORDER — SIMETHICONE 80 MG PO CHEW: 80.0000 mg | CHEWABLE_TABLET | ORAL | Status: DC | PRN

## 2018-10-06 MED ORDER — SIMETHICONE 80 MG PO CHEW
80.0000 mg | CHEWABLE_TABLET | Freq: Three times a day (TID) | ORAL | Status: DC
  Administered 2018-10-06 – 2018-10-09 (×8): 80 mg via ORAL
  Filled 2018-10-06 (×8): qty 1

## 2018-10-06 MED ORDER — SODIUM CHLORIDE 0.9 % IV SOLN
INTRAVENOUS | Status: DC | PRN
  Administered 2018-10-06: 11:00:00 via INTRAVENOUS

## 2018-10-06 MED ORDER — SENNOSIDES-DOCUSATE SODIUM 8.6-50 MG PO TABS
2.0000 | ORAL_TABLET | ORAL | Status: DC
  Administered 2018-10-06 – 2018-10-08 (×3): 2 via ORAL
  Filled 2018-10-06 (×3): qty 2

## 2018-10-06 MED ORDER — LACTATED RINGERS IV SOLN
INTRAVENOUS | Status: DC | PRN
  Administered 2018-10-06 (×2): via INTRAVENOUS

## 2018-10-06 MED ORDER — CEFAZOLIN SODIUM-DEXTROSE 2-3 GM-%(50ML) IV SOLR
INTRAVENOUS | Status: DC | PRN
  Administered 2018-10-06: 2 g via INTRAVENOUS

## 2018-10-06 MED ORDER — ZOLPIDEM TARTRATE 5 MG PO TABS: 5.0000 mg | ORAL_TABLET | Freq: Every evening | ORAL | Status: DC | PRN

## 2018-10-06 MED ORDER — PRENATAL MULTIVITAMIN CH
1.0000 | ORAL_TABLET | Freq: Every day | ORAL | Status: DC
  Administered 2018-10-07 – 2018-10-09 (×3): 1 via ORAL
  Filled 2018-10-06 (×3): qty 1

## 2018-10-06 MED ORDER — WITCH HAZEL-GLYCERIN EX PADS: 1.0000 "application " | MEDICATED_PAD | CUTANEOUS | Status: DC | PRN

## 2018-10-06 SURGICAL SUPPLY — 31 items
BARRIER ADHS 3X4 INTERCEED (GAUZE/BANDAGES/DRESSINGS) IMPLANT
CHLORAPREP W/TINT 26ML (MISCELLANEOUS) ×3 IMPLANT
CLAMP CORD UMBIL (MISCELLANEOUS) IMPLANT
CLOTH BEACON ORANGE TIMEOUT ST (SAFETY) ×3 IMPLANT
DRSG OPSITE POSTOP 4X10 (GAUZE/BANDAGES/DRESSINGS) ×3 IMPLANT
ELECT REM PT RETURN 9FT ADLT (ELECTROSURGICAL) ×3
ELECTRODE REM PT RTRN 9FT ADLT (ELECTROSURGICAL) ×1 IMPLANT
EXTRACTOR VACUUM KIWI (MISCELLANEOUS) IMPLANT
GLOVE BIO SURGEON STRL SZ 6.5 (GLOVE) ×2 IMPLANT
GLOVE BIO SURGEONS STRL SZ 6.5 (GLOVE) ×1
GLOVE BIOGEL PI IND STRL 7.0 (GLOVE) ×2 IMPLANT
GLOVE BIOGEL PI INDICATOR 7.0 (GLOVE) ×4
GOWN STRL REUS W/TWL LRG LVL3 (GOWN DISPOSABLE) ×6 IMPLANT
KIT ABG SYR 3ML LUER SLIP (SYRINGE) IMPLANT
NDL HYPO 25X5/8 SAFETYGLIDE (NEEDLE) IMPLANT
NEEDLE HYPO 22GX1.5 SAFETY (NEEDLE) IMPLANT
NEEDLE HYPO 25X5/8 SAFETYGLIDE (NEEDLE) IMPLANT
NS IRRIG 1000ML POUR BTL (IV SOLUTION) ×3 IMPLANT
PACK C SECTION WH (CUSTOM PROCEDURE TRAY) ×3 IMPLANT
PAD OB MATERNITY 4.3X12.25 (PERSONAL CARE ITEMS) ×3 IMPLANT
PENCIL SMOKE EVAC W/HOLSTER (ELECTROSURGICAL) ×3 IMPLANT
RETRACTOR WND ALEXIS 25 LRG (MISCELLANEOUS) IMPLANT
RTRCTR WOUND ALEXIS 25CM LRG (MISCELLANEOUS)
SPONGE LAP 18X18 RF (DISPOSABLE) ×9 IMPLANT
SUT VIC AB 0 CT1 36 (SUTURE) ×18 IMPLANT
SUT VIC AB 2-0 CT1 27 (SUTURE) ×2
SUT VIC AB 2-0 CT1 TAPERPNT 27 (SUTURE) ×1 IMPLANT
SUT VIC AB 4-0 PS2 27 (SUTURE) ×3 IMPLANT
SYR CONTROL 10ML LL (SYRINGE) IMPLANT
TOWEL OR 17X24 6PK STRL BLUE (TOWEL DISPOSABLE) ×3 IMPLANT
TRAY FOLEY W/BAG SLVR 14FR LF (SET/KITS/TRAYS/PACK) IMPLANT

## 2018-10-06 NOTE — Transfer of Care (Signed)
Immediate Anesthesia Transfer of Care Note  Patient: Mary Allen  Procedure(s) Performed: CESAREAN SECTION (N/A )  Patient Location: PACU  Anesthesia Type:Epidural  Level of Consciousness: drowsy  Airway & Oxygen Therapy: Patient Spontanous Breathing  Post-op Assessment: Report given to RN and Post -op Vital signs reviewed and stable  Post vital signs: stable  Last Vitals:  Vitals Value Taken Time  BP 94/58 10/06/2018 11:26 AM  Temp    Pulse 82 10/06/2018 11:28 AM  Resp 20 10/06/2018 11:28 AM  SpO2 98 % 10/06/2018 11:28 AM  Vitals shown include unvalidated device data.  Last Pain:  Vitals:   10/06/18 0817  TempSrc: Oral  PainSc:          Complications: No apparent anesthesia complications

## 2018-10-06 NOTE — Progress Notes (Signed)
Patient ID: Mary Allen, female   DOB: 09/13/1996, 23 y.o.   MRN: [id number]  RN pushed x 1 with pt, pushed past anterior lip.  Called to room and entered room at 0943.  Pt with FHR 130s-140s with decels to 70s during pushing.  Pushed with pt, limited progress in station and decels continuing to 70s during pushing.  Dr Debroah Loop called to room and entered room at 0955.  Pt verbally consented for use of vacuum and vacuum applied.  First vacuum, kiwi with poor suction so removed and bell vacuum applied.  Two pop offs then FHR baseline audible above 120 so pt pushed with Dr Debroah Loop x 3-4 contractions without vacuum.  Positive scalp stimulation at 1015.  FHR decels resumed so bell vacuum reapplied and pt pushed again.  Third pop off occurred. Pt pushed with Dr Debroah Loop again without vacuum but with delivery not imminent and FHR in 70s, stat C/S was called.  Pt left room at 1029 for OR.

## 2018-10-06 NOTE — Discharge Summary (Signed)
Postpartum Discharge Summary     Patient Name: Mary Allen DOB: 04-27-1996 MRN: [id number]  Date of admission: 10/05/2018 Delivering Provider: Adam Phenix   Date of discharge: 10/09/2018  Admitting diagnosis: 38 WKS, CTX 3 MIN APART Intrauterine pregnancy: [redacted]w[redacted]d     Secondary diagnosis:  Principal Problem:   Cesarean delivery delivered Active Problems:   Short cervix during pregnancy in second trimester   Short interval between pregnancies affecting pregnancy, antepartum   H/O pre-term labor   History of gestational hypertension   Indication for care in labor or delivery  Additional problems: none     Discharge diagnosis: Term Pregnancy Delivered                                                                                                Post partum procedures:n/a  Augmentation: AROM  Complications: None  Hospital course:  Onset of Labor With Unplanned C/S  23 y.o. yo [id number] at [redacted]w[redacted]d was admitted in Active Labor on 10/05/2018. Patient had a labor course significant for FHR decels prior to delivery with failed vacuum attempt. Membrane Rupture Time/Date: 4:37 AM ,10/06/2018   The patient went for cesarean section due to Non-Reassuring FHR, and delivered a Viable infant,10/06/2018  Details of operation can be found in separate operative note. Patient had an uncomplicated postpartum course.  She is ambulating,tolerating a regular diet, passing flatus, and urinating well.  Patient is discharged home in stable condition 10/09/18.  Magnesium Sulfate recieved: No BMZ received: No  Physical exam  Vitals:   10/08/18 0500 10/08/18 1523 10/08/18 2220 10/09/18 0713  BP: 92/80 126/75 127/66 106/79  Pulse: 85 92 75 79  Resp: 16 19 18 18   Temp: 98.3 F (36.8 C) 98.6 F (37 C) 98.9 F (37.2 C) 98.3 F (36.8 C)  TempSrc: Oral Oral Oral Oral  SpO2: 100%     Weight:      Height:       General: alert, cooperative and no distress Lochia: appropriate Uterine Fundus:  firm Incision: Healing well with no significant drainage DVT Evaluation: No evidence of DVT seen on physical exam. Labs: Lab Results  Component Value Date   WBC 20.3 (H) 10/07/2018   HGB 9.0 (L) 10/07/2018   HCT 27.5 (L) 10/07/2018   MCV 89.6 10/07/2018   PLT 170 10/07/2018   CMP Latest Ref Rng & Units 10/07/2018  Glucose 65 - 99 mg/dL -  BUN 6 - 20 mg/dL -  Creatinine 2.54 - 2.70 mg/dL 6.23  Sodium 762 - 831 mmol/L -  Potassium 3.5 - 5.2 mmol/L -  Chloride 96 - 106 mmol/L -  CO2 20 - 29 mmol/L -  Calcium 8.7 - 10.2 mg/dL -  Total Protein 6.0 - 8.5 g/dL -  Total Bilirubin 0.0 - 1.2 mg/dL -  Alkaline Phos 39 - 517 IU/L -  AST 0 - 40 IU/L -  ALT 0 - 32 IU/L -    Discharge instruction: per After Visit Summary and "Baby and Me Booklet".  After visit meds:  Allergies as of 10/09/2018   No Known Allergies  Medication List    STOP taking these medications   aspirin 81 MG chewable tablet   PRENATAL VITAMINS PO     TAKE these medications   acetaminophen 325 MG tablet Commonly known as:  TYLENOL Take 2 tablets (650 mg total) by mouth every 4 (four) hours as needed for mild pain (temperature > 101.5.).   ibuprofen 800 MG tablet Commonly known as:  ADVIL,MOTRIN Take 1 tablet (800 mg total) by mouth every 8 (eight) hours as needed.   senna-docusate 8.6-50 MG tablet Commonly known as:  Senokot-S Take 2 tablets by mouth daily. Start taking on:  October 10, 2018   witch hazel-glycerin pad Commonly known as:  TUCKS Apply 1 application topically as needed for hemorrhoids.       Diet: routine diet  Activity: Advance as tolerated. Pelvic rest for 6 weeks.   Outpatient follow up:2 weeks Follow up Appt: Future Appointments  Date Time Provider Department Center  10/31/2018  9:00 AM [facility] NURSE [facility]  10/31/2018  2:15 PM Adam Phenix, MD [facility]   Follow up Visit: Follow-up Information    [facility].    Specialty:  Obstetrics and Gynecology Contact information: [address] [place] [address] [phone]           Please schedule this patient for Postpartum visit in: 6 weeks with the following provider: Any provider For C/S patients schedule nurse incision check in weeks 2 weeks: yes Low risk pregnancy complicated by: n/a Delivery mode:  CS Anticipated Birth Control:  Nexplanon PP Procedures needed: Incision check  Schedule Integrated BH visit: no      Newborn Data: Live born female  Birth Weight: 6 lb 6.1 oz (2895 g) APGAR: 8, 9  Newborn Delivery   Birth date/time:  10/06/2018 10:38:00 Delivery type:  C-Section, Low Transverse Trial of labor:  Yes C-section categorization:  Primary     Baby Feeding: Breast Disposition:home with mother   10/09/2018 Cristine Polio, MD

## 2018-10-06 NOTE — Anesthesia Pain Management Evaluation Note (Signed)
  CRNA Pain Management Visit Note  Patient: Mary Allen, 23 y.o., female  "Hello I am a member of the anesthesia team at [facility]. We have an anesthesia team available at all times to provide care throughout the hospital, including epidural management and anesthesia for C-section. I don't know your plan for the delivery whether it a natural birth, water birth, IV sedation, nitrous supplementation, doula or epidural, but we want to meet your pain goals."   1.Was your pain managed to your expectations on prior hospitalizations?   Yes   2.What is your expectation for pain management during this hospitalization?     Epidural  3.How can we help you reach that goal? Epidural in place  Record the patient's initial score and the patient's pain goal.   Pain: 1  Pain Goal: 3 The [facility] wants you to be able to say your pain was always managed very well.  Mary Allen 10/06/2018

## 2018-10-06 NOTE — Addendum Note (Signed)
Addendum  created 10/06/18 1850 by Graciela Husbands, CRNA   Clinical Note Signed

## 2018-10-06 NOTE — Anesthesia Postprocedure Evaluation (Signed)
Anesthesia Post Note  Patient: Mary Allen  Procedure(s) Performed: CESAREAN SECTION (N/A )     Patient location during evaluation: PACU Anesthesia Type: Epidural Level of consciousness: awake and alert Pain management: pain level controlled Vital Signs Assessment: post-procedure vital signs reviewed and stable Respiratory status: spontaneous breathing, nonlabored ventilation and respiratory function stable Cardiovascular status: stable Postop Assessment: no headache, no backache and epidural receding Anesthetic complications: no    Last Vitals:  Vitals:   10/06/18 1301 10/06/18 1315  BP: 102/68 (!) 104/91  Pulse:  80  Resp: 17 19  Temp:    SpO2:  98%    Last Pain:  Vitals:   10/06/18 1330  TempSrc:   PainSc: Asleep   Pain Goal:                @ANFLOW60MIN (12500)  )Lewie Loron

## 2018-10-06 NOTE — Anesthesia Postprocedure Evaluation (Signed)
Anesthesia Post Note  Patient: Emilie Follansbee  Procedure(s) Performed: CESAREAN SECTION (N/A )     Patient location during evaluation: Mother Baby Anesthesia Type: Epidural Level of consciousness: awake and alert and oriented Pain management: satisfactory to patient Vital Signs Assessment: post-procedure vital signs reviewed and stable Respiratory status: respiratory function stable Cardiovascular status: stable Postop Assessment: no headache, no backache, epidural receding, patient able to bend at knees, no signs of nausea or vomiting and adequate PO intake Anesthetic complications: no    Last Vitals:  Vitals:   10/06/18 1515 10/06/18 1620  BP: 112/62 122/72  Pulse: 66 71  Resp: 20 20  Temp: 36.8 C 36.7 C  SpO2: 99% 100%    Last Pain:  Vitals:   10/06/18 1620  TempSrc: Axillary  PainSc: 1    Pain Goal: Patients Stated Pain Goal: 3 (10/06/18 1620)              @ANFLOW60MIN (12500)  )Gratia Disla

## 2018-10-06 NOTE — Progress Notes (Signed)
NICU at bedside for delivery

## 2018-10-06 NOTE — Plan of Care (Signed)
  Problem: Education: Goal: Knowledge of Childbirth will improve Outcome: Progressing Goal: Ability to make informed decisions regarding treatment and plan of care will improve Outcome: Progressing Goal: Ability to state and carry out methods to decrease the pain will improve Outcome: Progressing   Problem: Coping: Goal: Ability to verbalize concerns and feelings about labor and delivery will improve Outcome: Progressing   Problem: Life Cycle: Goal: Ability to make normal progression through stages of labor will improve Outcome: Progressing Goal: Ability to effectively push during vaginal delivery will improve Outcome: Progressing   Problem: Role Relationship: Goal: Ability to demonstrate positive interaction with the child will improve Outcome: Progressing   Problem: Safety: Goal: Risk of complications during labor and delivery will decrease Outcome: Progressing   Problem: Pain Management: Goal: Relief or control of pain from uterine contractions will improve Outcome: Progressing   Problem: Education: Goal: Knowledge of General Education information will improve Description Including pain rating scale, medication(s)/side effects and non-pharmacologic comfort measures Outcome: Progressing   Problem: Health Behavior/Discharge Planning: Goal: Ability to manage health-related needs will improve Outcome: Progressing   Problem: Clinical Measurements: Goal: Ability to maintain clinical measurements within normal limits will improve Outcome: Progressing Goal: Will remain free from infection Outcome: Progressing Goal: Diagnostic test results will improve Outcome: Progressing Goal: Respiratory complications will improve Outcome: Progressing Goal: Cardiovascular complication will be avoided Outcome: Progressing   Problem: Activity: Goal: Risk for activity intolerance will decrease Outcome: Progressing   Problem: Nutrition: Goal: Adequate nutrition will be  maintained Outcome: Progressing   Problem: Coping: Goal: Level of anxiety will decrease Outcome: Progressing   Problem: Elimination: Goal: Will not experience complications related to bowel motility Outcome: Progressing Goal: Will not experience complications related to urinary retention Outcome: Progressing   Problem: Pain Managment: Goal: General experience of comfort will improve Outcome: Progressing   Problem: Safety: Goal: Ability to remain free from injury will improve Outcome: Progressing   Problem: Skin Integrity: Goal: Risk for impaired skin integrity will decrease Outcome: Progressing   

## 2018-10-06 NOTE — Op Note (Signed)
Mary Allen PROCEDURE DATE: 10/06/2018  PREOPERATIVE DIAGNOSES: Intrauterine pregnancy at [redacted]w[redacted]d weeks gestation; failure to progress: arrest of descent and failed vacuum, fetal indication  POSTOPERATIVE DIAGNOSES: The same  PROCEDURE: Primary Low Transverse Cesarean Section  SURGEON:  Adam Phenix, MD  ASSISTANT:  OR team  ANESTHESIOLOGY TEAM: Anesthesiologist: Lewie Loron, MD; Trevor Iha, MD CRNA: Elgie Congo, CRNA; Renford Dills, CRNA  INDICATIONS: Mary Allen is a 23 y.o. [phone] at [redacted]w[redacted]d here for cesarean section secondary to the indications listed under preoperative diagnoses; please see preoperative note for further details.  The risks of cesarean section were discussed with the patient including but were not limited to: bleeding which may require transfusion or reoperation; infection which may require antibiotics; injury to bowel, bladder, ureters or other surrounding organs; injury to the fetus; need for additional procedures including hysterectomy in the event of a life-threatening hemorrhage; placental abnormalities wth subsequent pregnancies, incisional problems, thromboembolic phenomenon and other [id number] complications.   The patient concurred with the proposed plan, giving informed written consent for the procedure.   She experienced deep variable while pushing and she consented to vacuum. The head descended to outlet but after 3 pull-offs she still had FHR 80-90's and no more descent so cesarean section was called FINDINGS:  Viable female infant in cephalic presentation.  Apgars 8 and 9.  Clear amniotic fluid.  Intact placenta, three vessel cord.  Normal uterus, fallopian tubes and ovaries bilaterally.  ANESTHESIA: Epidural  INTRAVENOUS FLUIDS: 1500 ml   ESTIMATED BLOOD LOSS: 226 ml URINE OUTPUT:  100 ml SPECIMENS: Placenta sent to L&D COMPLICATIONS: None immediate  PROCEDURE IN DETAIL:  The patient preoperatively  received intravenous antibiotics and had sequential compression devices applied to her lower extremities.  She was then taken to the operating room where the epidural anesthesia was dosed up to surgical level and was found to be adequate. She was then placed in a dorsal supine position with a leftward tilt, and prepped and draped in a sterile manner.  A foley catheter was placed into her bladder and attached to constant gravity.  After an adequate timeout was performed, a Pfannenstiel skin incision was made with scalpel and carried through to the underlying layer of fascia. The fascia was incised in the midline, and this incision was extended bilaterally using the Mayo scissors.  Kocher clamps were applied to the superior aspect of the fascial incision and the underlying rectus muscles were dissected off bluntly and sharply.  A similar process was carried out on the inferior aspect of the fascial incision. The rectus muscles were separated in the midline and the peritoneum was entered bluntly. The Alexis self-retaining retractor was introduced into the abdominal cavity.  Attention was turned to the lower uterine segment where a low transverse hysterotomy was made with a scalpel and extended bilaterally bluntly.  The infant was successfully delivered, the cord was clamped and cut after one minute, and the infant was handed over to the awaiting neonatology team. Uterine massage was then administered, and the placenta delivered intact with a three-vessel cord. The uterus was then cleared of clots and debris.  The hysterotomy was closed with 0 Vicryl in a running locked fashion, and an imbricating layer was also placed with 0 Vicryl. The pelvis was cleared of all clot and debris. Hemostasis was confirmed on all surfaces.  The retractor was removed.  The peritoneum was closed with a 0 Vicryl running stitch. The fascia was then closed using 0 Vicryl in a  running fashion.  The subcutaneous layer was irrigated, , and the  skin was closed with a 4-0 Vicryl subcuticular stitch. The patient tolerated the procedure well. Sponge, instrument and needle counts were correct x 3.  She was taken to the recovery room in stable condition.    Adam Phenix, MD Obstetrician & Gynecologist, [facility] for [facility], [facility]

## 2018-10-06 NOTE — Progress Notes (Signed)
OB/GYN Faculty Practice: Labor Progress Note  Subjective: Doing well, comfortable with epidural.   Objective: BP 105/60   Pulse 84   Temp 97.6 F (36.4 C) (Oral)   Resp 16   Ht 4\' 11"  (1.499 m)   Wt 81.2 kg   LMP  (LMP Unknown)   SpO2 100%   BMI 36.15 kg/m  Gen: well-appearing, NAD, sleepy Dilation: 5.5 Effacement (%): 90 Station: -1, -2 Presentation: Vertex Exam by:: K Faucett RN  Assessment and Plan: 23 y.o. [id number] [redacted]w[redacted]d here with spontaneous onset of labor.  Labor: Slow change since admission and epidural placement. 4>5.5>now 6. Had BBOW so AROM with clear fluid. -- pain control: epidural in place -- PPH Risk: low  Fetal Well-Being: EFW 7-8lbs by Leopold's. Cephalic by sutures.  -- Category I - continuous fetal monitoring - periods of category II strip but improved with upright positioning, has received some phenylephrine with epidural -- GBS negative   Laurel S. Earlene Plater, DO OB/GYN Fellow, Faculty Practice  4:42 AM

## 2018-10-07 LAB — CBC
HCT: 27.5 % — ABNORMAL LOW (ref 36.0–46.0)
Hemoglobin: 9 g/dL — ABNORMAL LOW (ref 12.0–15.0)
MCH: 29.3 pg (ref 26.0–34.0)
MCHC: 32.7 g/dL (ref 30.0–36.0)
MCV: 89.6 fL (ref 80.0–100.0)
NRBC: 0 % (ref 0.0–0.2)
Platelets: 170 10*3/uL (ref 150–400)
RBC: 3.07 MIL/uL — ABNORMAL LOW (ref 3.87–5.11)
RDW: 13.2 % (ref 11.5–15.5)
WBC: 20.3 10*3/uL — AB (ref 4.0–10.5)

## 2018-10-07 LAB — RPR: RPR: NONREACTIVE

## 2018-10-07 LAB — CREATININE, SERUM
Creatinine, Ser: 0.49 mg/dL (ref 0.44–1.00)
GFR calc non Af Amer: >60 mL/min (ref >60)

## 2018-10-07 LAB — BIRTH TISSUE RECOVERY COLLECTION (PLACENTA DONATION)

## 2018-10-07 NOTE — Progress Notes (Addendum)
Post Op Day #1 pLTCS following arrest of descent and failed vacuum. Subjective: no complaints, up ad lib, voiding and tolerating PO. Patient reports that she has not yet has a BM but has been able to pass gas. She reports no concerns with the incision. Her pain has been well controlled with ibuprofen, rating it 2/10.  Objective: Blood pressure (!) 106/58, pulse 73, temperature 98.3 F (36.8 C), temperature source Oral, resp. rate 18, height 4\' 11"  (1.499 m), weight 81.2 kg, SpO2 100 %, unknown if currently breastfeeding.  Physical Exam:  General: alert, cooperative and no distress Lochia: appropriate Uterine Fundus: firm Incision: healing well, no significant drainage, no dehiscence, no significant erythema DVT Evaluation: No evidence of DVT seen on physical exam.  Recent Labs    10/05/18 2238 10/07/18 0524  HGB 10.8* 9.0*  HCT 32.6* 27.5*    Assessment/Plan: Plan for discharge tomorrow and Contraception Nexplanon   LOS: 2 days   Gabbie Marzo 10/07/2018, 7:53 AM

## 2018-10-07 NOTE — Discharge Instructions (Signed)

## 2018-10-08 NOTE — Progress Notes (Signed)
Post Op Day #2 pLTCS following arrest of descent and failed vacuum. Subjective: no complaints, up ad lib, voiding and tolerating PO. Patient reports that she has not yet has a BM but has been able to pass gas. She reports no concerns with the incision. Her pain has been well controlled with ibuprofen, rating it 2/10.  Objective: Blood pressure 92/80, pulse 85, temperature 98.3 F (36.8 C), temperature source Oral, resp. rate 16, height 4\' 11"  (1.499 m), weight 81.2 kg, SpO2 100 %, unknown if currently breastfeeding.  Physical Exam:  General: alert, cooperative and no distress Lochia: appropriate Uterine Fundus: firm Incision: healing well, no significant drainage, no dehiscence, no significant erythema DVT Evaluation: No evidence of DVT seen on physical exam.  Recent Labs    10/05/18 2238 10/07/18 0524  HGB 10.8* 9.0*  HCT 32.6* 27.5*    Assessment/Plan: Plan for discharge tomorrow and Contraception Nexplanon   LOS: 3 days   Gorge Almanza L 10/08/2018, 7:43 AM

## 2018-10-09 MED ORDER — WITCH HAZEL-GLYCERIN EX PADS: 1.0000 "application " | MEDICATED_PAD | CUTANEOUS | 12 refills | Status: DC | PRN

## 2018-10-09 MED ORDER — IBUPROFEN 800 MG PO TABS: 800.0000 mg | ORAL_TABLET | Freq: Three times a day (TID) | ORAL | 0 refills | Status: AC | PRN

## 2018-10-09 MED ORDER — OXYCODONE-ACETAMINOPHEN 5-325 MG PO TABS: 1.0000 | ORAL_TABLET | Freq: Four times a day (QID) | ORAL | 0 refills | Status: DC | PRN

## 2018-10-09 MED ORDER — SENNOSIDES-DOCUSATE SODIUM 8.6-50 MG PO TABS: 2.0000 | ORAL_TABLET | ORAL | 0 refills | Status: DC

## 2018-10-09 MED ORDER — ACETAMINOPHEN 325 MG PO TABS: 650.0000 mg | ORAL_TABLET | ORAL | 2 refills | Status: AC | PRN

## 2018-10-10 ENCOUNTER — Encounter: Payer: 59 | Admitting: Family Medicine

## 2018-10-11 ENCOUNTER — Telehealth: Payer: Self-pay | Admitting: Student

## 2018-10-11 NOTE — Telephone Encounter (Signed)
Called patient to inform of adjustment in appointment date. Patient agreed to the new date and time.

## 2018-10-17 ENCOUNTER — Encounter: Payer: 59 | Admitting: Family Medicine

## 2018-10-20 ENCOUNTER — Ambulatory Visit: Payer: 59

## 2018-10-21 ENCOUNTER — Ambulatory Visit: Payer: 59

## 2018-10-21 ENCOUNTER — Telehealth: Payer: Self-pay | Admitting: Family Medicine

## 2018-10-21 NOTE — Telephone Encounter (Signed)
Attempted to call pt about coming to her appt on 1/31 for her incision check but not answering when she was called. Pt signed in the kiosk and was checked -in. No answer when attempted to call about this.

## 2018-10-28 NOTE — Addendum Note (Signed)
Addendum  created 10/28/18 1213 by Trevor Iha, MD   Intraprocedure Staff edited

## 2018-10-31 ENCOUNTER — Ambulatory Visit: Payer: 59 | Admitting: Obstetrics & Gynecology

## 2018-10-31 ENCOUNTER — Ambulatory Visit: Payer: 59

## 2018-11-15 ENCOUNTER — Ambulatory Visit (INDEPENDENT_AMBULATORY_CARE_PROVIDER_SITE_OTHER): Payer: PRIVATE HEALTH INSURANCE | Admitting: Student

## 2018-11-15 ENCOUNTER — Ambulatory Visit: Payer: 59 | Admitting: Student

## 2018-11-15 ENCOUNTER — Other Ambulatory Visit (HOSPITAL_COMMUNITY)
Admission: RE | Admit: 2018-11-15 | Discharge: 2018-11-15 | Disposition: A | Discharge status: Home / Self Care | Payer: Medicaid Other | Source: Ambulatory Visit | Attending: Student | Admitting: Student

## 2018-11-15 ENCOUNTER — Encounter: Payer: Self-pay | Admitting: Student

## 2018-11-15 DIAGNOSIS — Z30017 Encounter for initial prescription of implantable subdermal contraceptive: Secondary | ICD-10-CM

## 2018-11-15 DIAGNOSIS — Z3202 Encounter for pregnancy test, result negative: Secondary | ICD-10-CM

## 2018-11-15 DIAGNOSIS — Z124 Encounter for screening for malignant neoplasm of cervix: Secondary | ICD-10-CM | POA: Diagnosis not present

## 2018-11-15 DIAGNOSIS — Z1389 Encounter for screening for other disorder: Secondary | ICD-10-CM

## 2018-11-15 LAB — POCT PREGNANCY, URINE: Preg Test, Ur: NEGATIVE

## 2018-11-15 MED ORDER — ETONOGESTREL 68 MG ~~LOC~~ IMPL
68.0000 mg | DRUG_IMPLANT | Freq: Once | SUBCUTANEOUS | Status: AC
  Administered 2018-11-15: 68 mg via SUBCUTANEOUS

## 2018-11-15 NOTE — Patient Instructions (Signed)
Nexplanon Instructions After Insertion  Keep bandage clean and dry for 24 hours  May use ice/Tylenol/Ibuprofen for soreness or pain  If you develop fever, drainage or increased warmth from incision site-contact office immediately   

## 2018-11-15 NOTE — Progress Notes (Signed)
Subjective:     Mary Allen is a 23 y.o. female who presents for a postpartum visit. She is 5 weeks postpartum following a low cervical transverse Cesarean section. I have fully reviewed the prenatal and intrapartum course. The delivery was at [redacted]w[redacted]d gestational weeks. Outcome: primary cesarean section, low transverse incision. Anesthesia: epidural. Postpartum course has been uncomplicated. Baby's course has been unremarkable. Baby is feeding by bottle - Daron Offer. Bleeding no bleeding. Bowel function is normal. Bladder function is normal. Patient is not sexually active. Contraception method is Nexplanon. Postpartum depression screening: negative.  The following portions of the patient's history were reviewed and updated as appropriate: allergies, current medications, past family history, past medical history, past social history, past surgical history and problem list.  Review of Systems Pertinent items are noted in HPI.   Objective:    BP 94/62   Pulse 85   Ht 4\' 11"  (1.499 m)   Wt 161 lb 11.2 oz (73.3 kg)   LMP  (LMP Unknown)   Breastfeeding No   BMI 32.66 kg/m   General:  alert, cooperative and no distress   Breasts:  inspection negative, no nipple discharge or bleeding, no masses or nodularity palpable  Lungs: clear to auscultation bilaterally  Heart:  regular rate and rhythm, S1, S2 normal, no murmur, click, rub or gallop  Abdomen: soft, non-tender; bowel sounds normal; no masses,  no organomegaly   Vulva:  normal  Vagina: normal vagina, no discharge, exudate, lesion, or erythema  Cervix:  Normal appearing, no lesions or cysts; no CMT.   Corpus: not examined  Adnexa:  not evaluated  Rectal Exam: Not performed.        Assessment:    Normal postpartum exam. Pap smear done at today's visit.  UPT negative today.  Plan:    1. Contraception: Nexplanon placed today. See insertion note below.  2. Pap smear done today 3. Follow up in: one year or as needed.       NEXPLANON INSERTION: Appropriate time out taken. Nexlanon site (left arm) identified and the area was prepped in usual sterile fashon. 2 cc of 1% lidocaine was used to anesthetize the area starting with the distal end.   Next, the area was cleansed with betadine and the Nexplanon was inserted without difficulty.  Pressure bandage was applied.  Pt was instructed to remove pressure bandage in a few hours, and keep insertion site covered with a bandaid for 3 days.

## 2018-11-18 ENCOUNTER — Ambulatory Visit: Payer: 59 | Admitting: Obstetrics & Gynecology

## 2018-11-18 LAB — CYTOLOGY - PAP: Diagnosis: NEGATIVE

## 2018-12-14 ENCOUNTER — Encounter: Payer: Self-pay | Admitting: *Deleted

## 2020-04-16 IMAGING — US US MFM OB TRANSVAGINAL
1 series · 13 of 28 positions shown · non-contrast
Comparison: none

[Series 1: us mfm ob transvaginal · 13 of 122 slices shown]
[im 5/122]
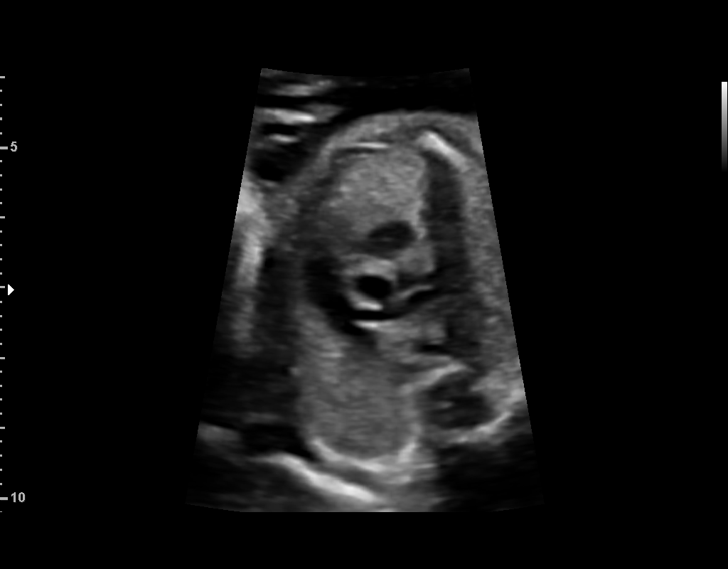
[im 14/122]
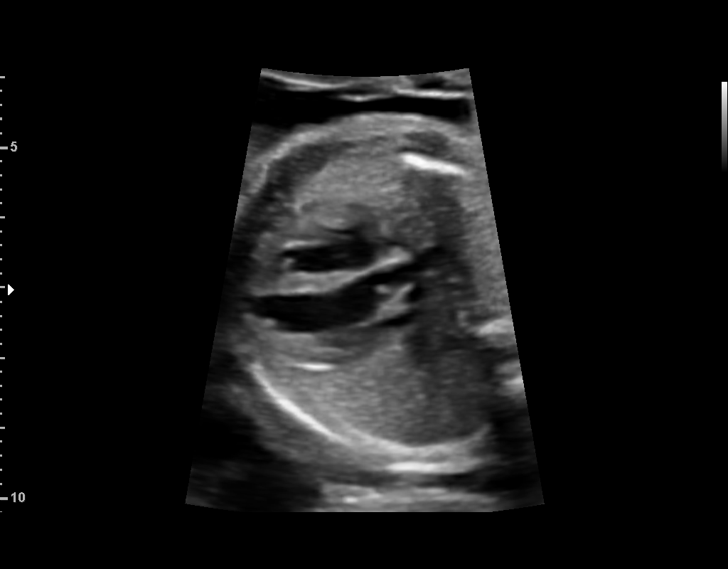
[im 23/122]
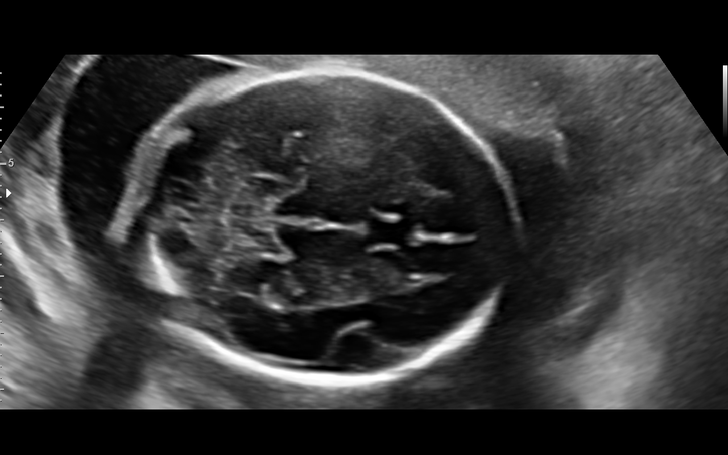
[im 32/122]
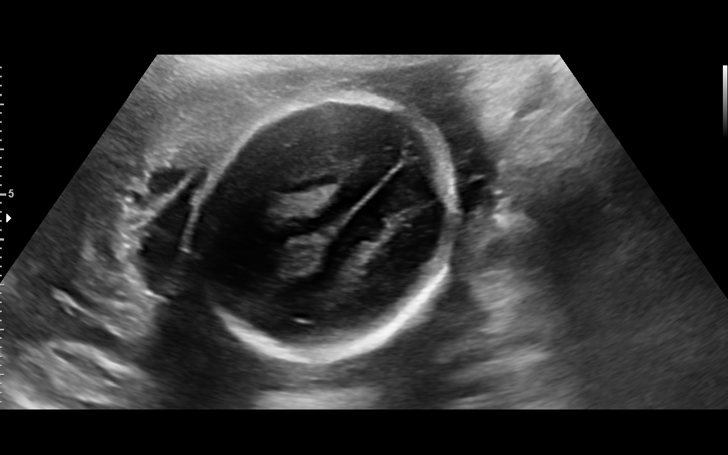
[im 41/122]
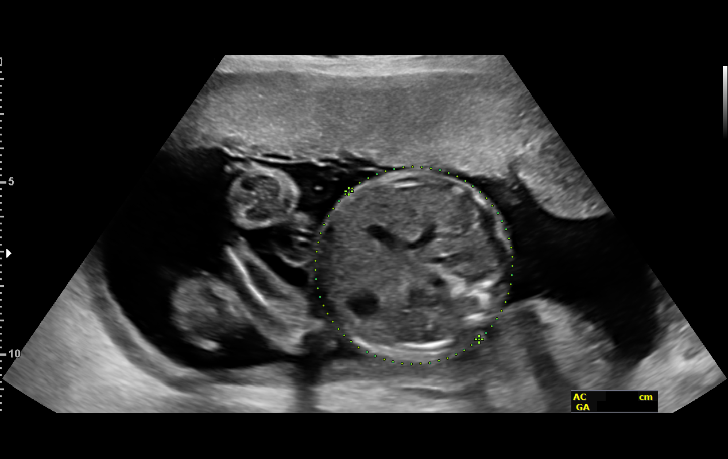
[im 50/122]
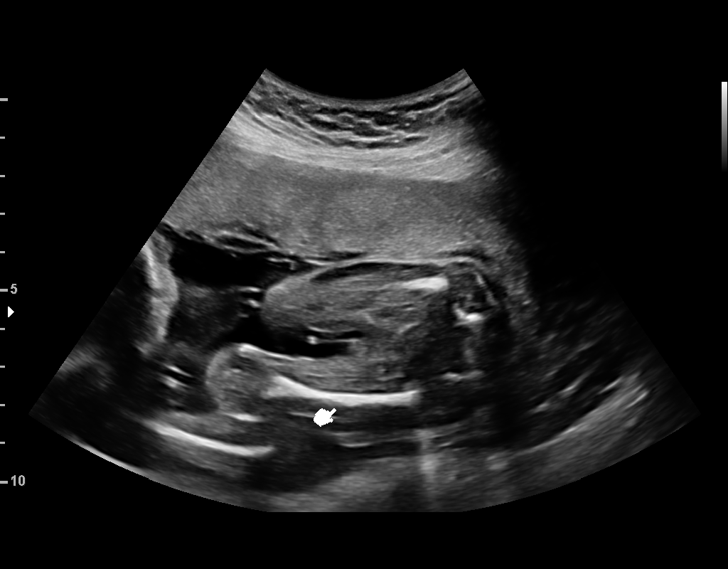
[im 63/122]
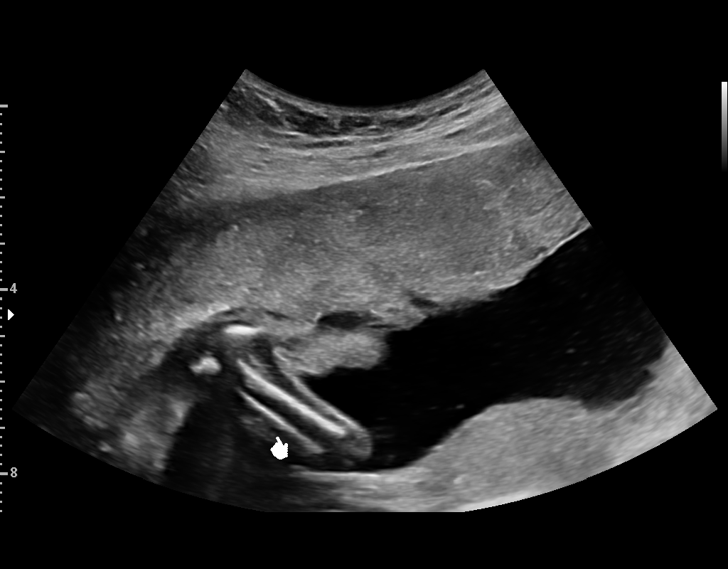
[im 72/122]
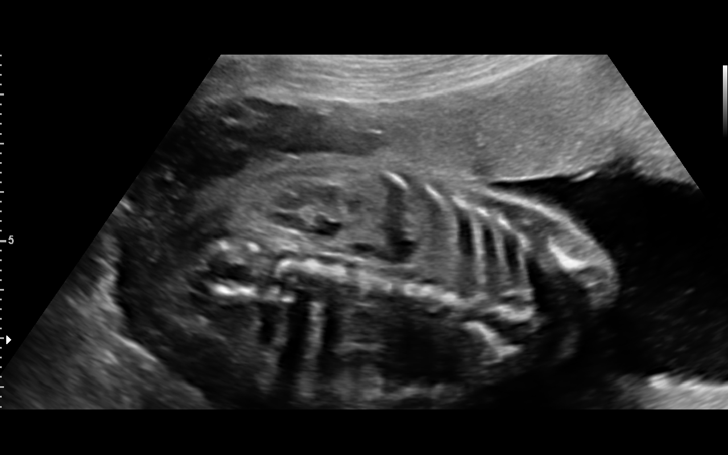
[im 81/122]
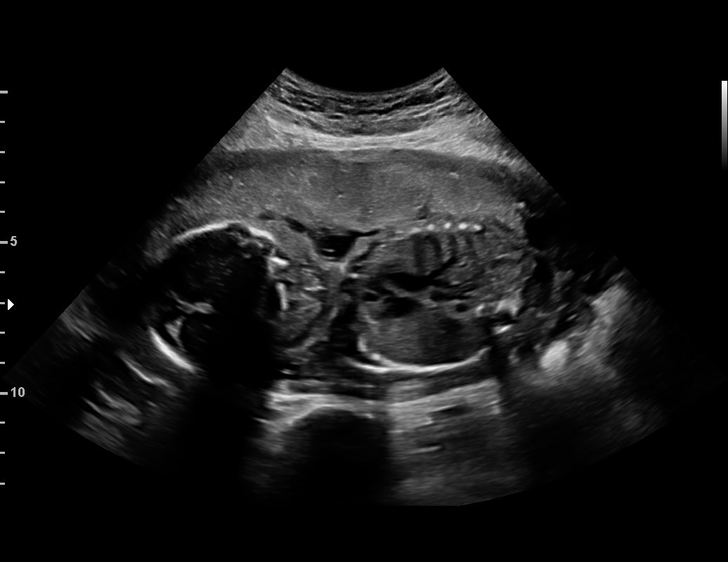
[im 90/122]
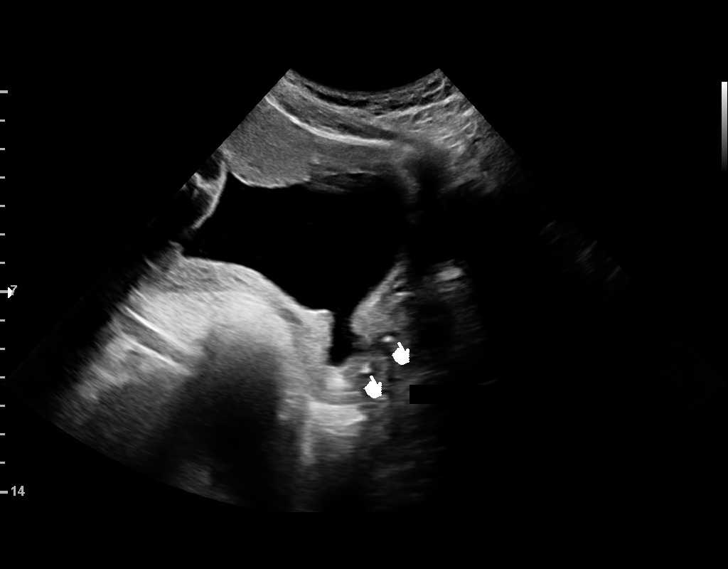
[im 99/122]
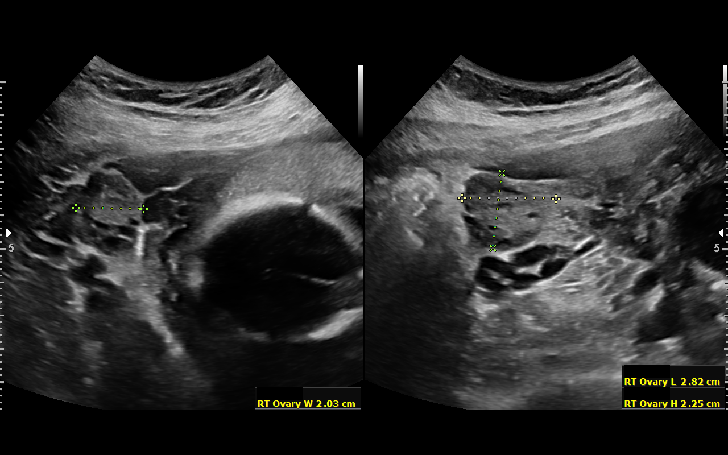
[im 108/122]
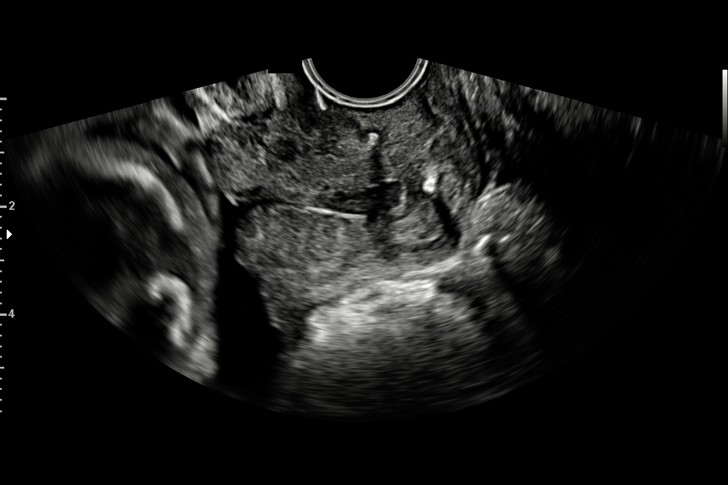
[im 117/122]
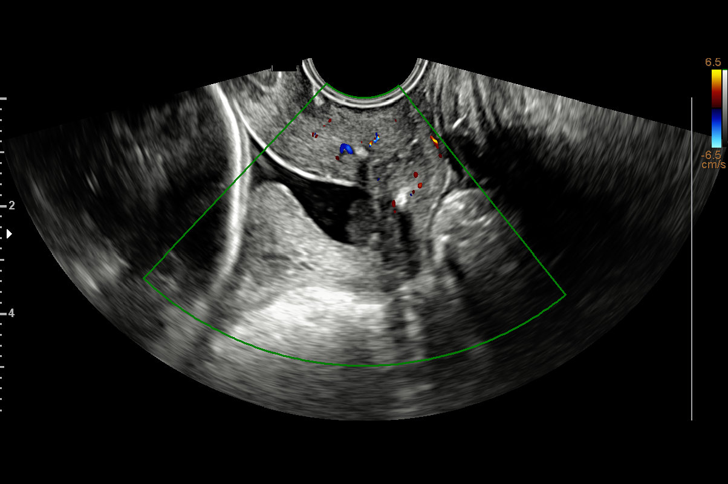

[13 of 28 positions shown; findings below may reference images not displayed]

Attending:        Ioksim Zalameda      Secondary Phy.:   [facility]-
MAU/Triage

2  US MFM OB TRANSVAGINAL               76817.2      FARIA MARRA

Indications

Encounter for antenatal screening for
malformations
Encounter for cervical length
22 weeks gestation of pregnancy
Poor obstetric history: Previous preterm
delivery, antepartum (@27 wks)
Cervical cerclage suture present, second
trimester (placed 05/05/18 in [REDACTED])
Poor obstetric history: Previous
preeclampsia / eclampsia/gestational HTN
Cervical shortening, second trimester
(Started Clase 06/02/18)
Vital Signs

BMI:         29.69        Pulse:  85
BP:          114/59
Fetal Evaluation

Num Of Fetuses:         1
Fetal Heart Rate(bpm):  148
Cardiac Activity:       Observed
Presentation:           Transverse, head to maternal right
Placenta:               Anterior
P. Cord Insertion:      Visualized

Amniotic Fluid
AFI FV:      Within normal limits
Largest Pocket(cm)
5.67
Biometry

BPD:      54.2  mm     G. Age:  22w 3d         37  %    CI:        72.42   %    70 - 86
FL/HC:      19.8   %    19.2 -
HC:      202.6  mm     G. Age:  22w 3d         24  %    HC/AC:      1.12        1.05 -
AC:      180.4  mm     G. Age:  22w 6d         48  %    FL/BPD:     74.0   %    71 - 87
FL:       40.1  mm     G. Age:  23w 0d         47  %    FL/AC:      22.2   %    20 - 24
HUM:      37.4  mm     G. Age:  23w 1d         53  %
CER:        26  mm     G. Age:  24w 0d         69  %
LV:        5.5  mm
CM:        3.9  mm

Est. FW:     540  gm      1 lb 3 oz     54  %
OB History

Gravidity:    1         Prem:   1
Living:       1
Gestational Age

U/S Today:     22w 5d                                        EDD:   10/14/18
Best:          22w 5d     Det. By:  U/S (06/15/18)           EDD:   10/14/18
Anatomy

Cranium:               Appears normal         Aortic Arch:            Appears normal
Cavum:                 Appears normal         Ductal Arch:            Appears normal
Ventricles:            Appears normal         Diaphragm:              Appears normal
Choroid Plexus:        Appears normal         Stomach:                Appears normal, left
sided
Cerebellum:            Appears normal         Abdomen:                Appears normal
Posterior Fossa:       Appears normal         Abdominal Wall:         Appears nml (cord
insert, abd wall)
Nuchal Fold:           Not applicable (>20    Cord Vessels:           Appears normal (3
wks GA)                                        vessel cord)
Face:                  Appears normal         Kidneys:                Appear normal
(orbits and profile)
Lips:                  Appears normal         Bladder:                Appears normal
Thoracic:              Appears normal         Spine:                  Not well visualized
Heart:                 Appears normal         Upper Extremities:      Appears normal
(4CH, axis, and situs
RVOT:                  Appears normal         Lower Extremities:      Appears normal
LVOT:                  Appears normal

Other:  Fetus appears to be female. Heels visualized. Nasal bone visualized.
Technically difficult due to fetal position.
Cervix Uterus Adnexa

Cervix
Length:            1.1  cm.
Cerclage visualized. Appears funneled, see comments.

Uterus
No abnormality visualized.
Left Ovary
Size(cm)       2.8  x   1.8    x  2.3       Vol(ml):
Within normal limits.

Right Ovary
Size(cm)       2.8  x   2.3    x  2         Vol(ml):
Within normal limits.
Impression

Normal interval growth.  No ultrasonic evidence of structural
fetal anomalies.
On transvaginal scan, the cervix measures 1.1 cm with
cercalge in place. Funneling to cerclage.
Recommendations

Follow up suboptimal views of the fetal a spine in 4 weeks.
